# Patient Record
Sex: Male | Born: 1995 | Race: White | Hispanic: No | Marital: Single | Smoking: Current some day smoker
Health system: Southern US, Community
[De-identification: ages and names within clinical notes are randomized; demographics above are authoritative.]

## PROBLEM LIST (undated history)

## (undated) DIAGNOSIS — F909 Attention-deficit hyperactivity disorder, unspecified type: Secondary | ICD-10-CM

## (undated) DIAGNOSIS — F1910 Other psychoactive substance abuse, uncomplicated: Secondary | ICD-10-CM

## (undated) HISTORY — PX: FOOT SURGERY: SHX648

---
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
## 2015-02-11 ENCOUNTER — Emergency Department
Admit: 2015-02-11 | Disposition: A | Discharge status: Home / Self Care | Payer: Self-pay | Admitting: Emergency Medicine

## 2015-02-11 LAB — URINALYSIS, COMPLETE
BILIRUBIN, UR: NEGATIVE
Bacteria: NONE SEEN
Blood: NEGATIVE
Glucose,UR: NEGATIVE mg/dL (ref 0–75)
Ketone: NEGATIVE
Leukocyte Esterase: NEGATIVE
NITRITE: NEGATIVE
PH: 7 (ref 4.5–8.0)
Protein: NEGATIVE
Specific Gravity: 1.011 (ref 1.003–1.030)
Squamous Epithelial: NONE SEEN

## 2016-04-15 ENCOUNTER — Encounter: Payer: Self-pay | Admitting: Emergency Medicine

## 2016-04-15 ENCOUNTER — Emergency Department: Payer: Medicaid Other

## 2016-04-15 ENCOUNTER — Emergency Department
Admission: EM | Admit: 2016-04-15 | Discharge: 2016-04-15 | Disposition: A | Discharge status: Home / Self Care | Payer: Medicaid Other | Attending: Emergency Medicine | Admitting: Emergency Medicine

## 2016-04-15 ENCOUNTER — Other Ambulatory Visit: Payer: Self-pay

## 2016-04-15 DIAGNOSIS — Z79899 Other long term (current) drug therapy: Secondary | ICD-10-CM | POA: Diagnosis not present

## 2016-04-15 DIAGNOSIS — F10129 Alcohol abuse with intoxication, unspecified: Secondary | ICD-10-CM | POA: Insufficient documentation

## 2016-04-15 DIAGNOSIS — F17200 Nicotine dependence, unspecified, uncomplicated: Secondary | ICD-10-CM | POA: Insufficient documentation

## 2016-04-15 DIAGNOSIS — Y9389 Activity, other specified: Secondary | ICD-10-CM | POA: Insufficient documentation

## 2016-04-15 DIAGNOSIS — W1839XA Other fall on same level, initial encounter: Secondary | ICD-10-CM | POA: Diagnosis not present

## 2016-04-15 DIAGNOSIS — Y999 Unspecified external cause status: Secondary | ICD-10-CM | POA: Insufficient documentation

## 2016-04-15 DIAGNOSIS — Y9289 Other specified places as the place of occurrence of the external cause: Secondary | ICD-10-CM | POA: Insufficient documentation

## 2016-04-15 DIAGNOSIS — F909 Attention-deficit hyperactivity disorder, unspecified type: Secondary | ICD-10-CM | POA: Insufficient documentation

## 2016-04-15 DIAGNOSIS — F10920 Alcohol use, unspecified with intoxication, uncomplicated: Secondary | ICD-10-CM

## 2016-04-15 DIAGNOSIS — F1290 Cannabis use, unspecified, uncomplicated: Secondary | ICD-10-CM | POA: Insufficient documentation

## 2016-04-15 DIAGNOSIS — S0990XA Unspecified injury of head, initial encounter: Secondary | ICD-10-CM | POA: Diagnosis not present

## 2016-04-15 HISTORY — DX: Attention-deficit hyperactivity disorder, unspecified type: F90.9

## 2016-04-15 NOTE — ED Notes (Signed)
Spoke with dr. York Ceriseforbach regarding pt's chief complaint and possible head injury. md states will evaluate, no new orders received.

## 2016-04-15 NOTE — ED Notes (Signed)
Explanation of treatment process explained to pt who states "i have a job i have to go to in the morning. i really need to get out of here."

## 2016-04-15 NOTE — ED Notes (Signed)
Per ems pt's "lady friend" states pt smoked too much marijuana. Pt states drank "a six pack of beer and smoke some weed." per ems pt fell off porch, striking head. perrl sluggish 4mm. Pt moves all extremities. resps unlabored.

## 2016-04-15 NOTE — ED Provider Notes (Signed)
[facility] Emergency Department Provider Note  ____________________________________________  Time seen: Approximately 3:14 AM  I have reviewed the triage vital signs and the nursing notes.   HISTORY  Chief Complaint Alcohol Intoxication and Head Injury  The patient may be slightly intoxicated although he appears clinically sober at this time, but it may still affect his history  HPI Matthias HughsBrandon K Monarch is a 20 y.o. male who admits to smoking marijuana and drinking alcohol earlier tonight presents for evaluation of a possible head injury.  Reportedly he had several beers and was smoking weed earlier tonight and then fell off porch.  He struckhis forehead and face on the concrete and reportedly was somewhat slow to respond afterwards.  He currently seems clinically sober and endorses no pain in his head or his neck.  He says that he feels fine and denies any extremity injuries as well.  He describes his symptoms as nonexistent although reportedly earlier he was somewhat somnolent and altered.  The onset of symptoms was acute.   Past Medical History  Diagnosis Date  . ADHD (attention deficit hyperactivity disorder)     There are no active problems to display for this patient.   Past Surgical History  Procedure Laterality Date  . Foot surgery      Current Outpatient Rx  Name  Route  Sig  Dispense  Refill  . amphetamine-dextroamphetamine (ADDERALL) 10 MG tablet   Oral   Take 1 tablet by mouth daily.      0   . polyethylene glycol powder (GLYCOLAX/MIRALAX) powder   Oral   Take 17 g by mouth daily.      5   . VYVANSE 50 MG capsule   Oral   Take 1 capsule by mouth every morning.      0     Dispense as written.     Allergies Review of patient's allergies indicates no known allergies.  History reviewed. No pertinent family history.  Social History Social History  Substance Use Topics  . Smoking status: Current Some Day Smoker  .  Smokeless tobacco: Never Used  . Alcohol Use: Yes    Review of Systems Constitutional: No fever/chills Eyes: No visual changes. ENT: No sore throat. Cardiovascular: Denies chest pain. Respiratory: Denies shortness of breath. Gastrointestinal: No abdominal pain.  No nausea, no vomiting.  No diarrhea.  No constipation. Genitourinary: Negative for dysuria. Musculoskeletal: Negative for back pain. Skin: Negative for rash. Neurological: Negative for headaches, focal weakness or numbness.  10-point ROS otherwise negative.  ____________________________________________   PHYSICAL EXAM:  VITAL SIGNS: ED Triage Vitals  Enc Vitals Group     BP 04/15/16 0136 118/75 mmHg     Pulse Rate 04/15/16 0136 93     Resp 04/15/16 0136 14     Temp 04/15/16 0136 98.9 F (37.2 C)     Temp Source 04/15/16 0136 Oral     SpO2 04/15/16 0136 98 %     Weight 04/15/16 0136 121 lb (54.885 kg)     Height 04/15/16 0136 5\' 4"  (1.626 m)     Head Cir --      Peak Flow --      Pain Score --      Pain Loc --      Pain Edu? --      Excl. in GC? --     Constitutional: Alert and oriented. Well appearing and in no acute distress. Eyes: Conjunctivae are Normal.  Pupils were initially dilated but  the patient is in a dark room and they were appropriately reactive to light. Head: Mild contusion on forehead otherwise unremarkable and atraumatic Nose: No congestion/rhinnorhea. Mouth/Throat: Mucous membranes are moist.  Oropharynx non-erythematous. Neck: No stridor.  No meningeal signs.  No cervical spine tenderness to palpation. Cardiovascular: Normal rate, regular rhythm. Good peripheral circulation. Grossly normal heart sounds.   Respiratory: Normal respiratory effort.  No retractions. Lungs CTAB. Gastrointestinal: Soft and nontender. No distention.  Musculoskeletal: No lower extremity tenderness nor edema. No gross deformities of extremities. Neurologic:  Normal speech and language. No gross focal neurologic  deficits are appreciated.  Skin:  Skin is warm, dry and intact. No rash noted. Psychiatric: Mood and affect are normal. Speech and behavior are normal.  ____________________________________________   LABS (all labs ordered are listed, but only abnormal results are displayed)  Labs Reviewed - No data to display ____________________________________________  EKG  ED ECG REPORT I, Kirat Mezquita, the attending physician, personally viewed and interpreted this ECG.  Date: 04/15/2016 EKG Time: 01:28 Rate: 98 Rhythm: normal sinus rhythm QRS Axis: normal Intervals: normal ST/T Wave abnormalities: Early repolarization in leads V2 and V3 with no reciprocal changes Conduction Disturbances: none Narrative Interpretation: unremarkable  ____________________________________________  RADIOLOGY   Ct Head Wo Contrast  04/15/2016  CLINICAL DATA:  20 year old male with fall and trauma the head EXAM: CT HEAD WITHOUT CONTRAST TECHNIQUE: Contiguous axial images were obtained from the base of the skull through the vertex without intravenous contrast. COMPARISON:  Head CT dated 02/14/2014 FINDINGS: The ventricles and the sulci are appropriate in size for the patient's age. There is no intracranial hemorrhage. No midline shift or mass effect identified. The gray-white matter differentiation is preserved. There is mild diffuse mucoperiosteal thickening of paranasal sinuses. No air-fluid levels. The mastoid air cells are clear. Calvarium is intact. IMPRESSION: No acute intracranial pathology. Electronically Signed   By: Elgie CollardArash  Radparvar M.D.   On: 04/15/2016 03:42    ____________________________________________   PROCEDURES  Procedure(s) performed:   Procedures   ____________________________________________   INITIAL IMPRESSION / ASSESSMENT AND PLAN / ED COURSE  Pertinent labs & imaging results that were available during my care of the patient were reviewed by me and considered in my medical  decision making (see chart for details).  The patient is well-appearing and in no acute distress with no symptoms.  He appears clinically sober and stable.  His head CT is unremarkable.  I feel that he does not require additional workup and he agrees.  He is being discharged in the company of a sober adult.  I gave my usual and customary return precautions.   ____________________________________________  FINAL CLINICAL IMPRESSION(S) / ED DIAGNOSES  Final diagnoses:  Closed head injury, initial encounter  Alcohol intoxication, uncomplicated (HCC)     MEDICATIONS GIVEN DURING THIS VISIT:  Medications - No data to display   NEW OUTPATIENT MEDICATIONS STARTED DURING THIS VISIT:  New Prescriptions   No medications on file      Note:  This document was prepared using Dragon voice recognition software and may include unintentional dictation errors.   Loleta Roseory Haadiya Frogge, MD 04/15/16 [phone]

## 2016-04-15 NOTE — ED Notes (Signed)
Pt continues to await ride home.

## 2016-04-15 NOTE — ED Notes (Signed)
Pt to be discharged, pt is alert and oriented x4, perrl 3mm and brisk. Pt denies pain. Pt requesting sister alexis to come and pick him up. Sister phoned with request per pt. Skin warm and dry, pt ambulatory without difficulty.

## 2016-04-15 NOTE — Discharge Instructions (Signed)
You have been seen in the Emergency Department (ED) today for a fall.  Your work up does not show any concerning injuries.  Please take over-the-counter ibuprofen and/or Tylenol as needed for your pain (unless you have an allergy or your doctor as told you not to take them), or take any prescribed medication as instructed.  Please follow up with your doctor regarding today's Emergency Department (ED) visit and your recent fall.    Return to the ED if you have any headache, confusion, slurred speech, weakness/numbness of any arm or leg, or any increased pain.   Head Injury, Adult You have a head injury. Headaches and throwing up (vomiting) are common after a head injury. It should be easy to wake up from sleeping. Sometimes you must stay in the hospital. Most problems happen within the first 24 hours. Side effects may occur up to 7-10 days after the injury.  WHAT ARE THE TYPES OF HEAD INJURIES? Head injuries can be as minor as a bump. Some head injuries can be more severe. More severe head injuries include:  A jarring injury to the brain (concussion).  A bruise of the brain (contusion). This mean there is bleeding in the brain that can cause swelling.  A cracked skull (skull fracture).  Bleeding in the brain that collects, clots, and forms a bump (hematoma). WHEN SHOULD I GET HELP RIGHT AWAY?   You are confused or sleepy.  You cannot be woken up.  You feel sick to your stomach (nauseous) or keep throwing up (vomiting).  Your dizziness or unsteadiness is getting worse.  You have very bad, lasting headaches that are not helped by medicine. Take medicines only as told by your doctor.  You cannot use your arms or legs like normal.  You cannot walk.  You notice changes in the black spots in the center of the colored part of your eye (pupil).  You have clear or bloody fluid coming from your nose or ears.  You have trouble seeing. During the next 24 hours after the injury, you must  stay with someone who can watch you. This person should get help right away (call 911 in the [place] if you start to shake and are not able to control it (have seizures), you pass out, or you are unable to wake up. HOW CAN I PREVENT A HEAD INJURY IN THE FUTURE?  Wear seat belts.  Wear a helmet while bike riding and playing sports like football.  Stay away from dangerous activities around the house. WHEN CAN I RETURN TO NORMAL ACTIVITIES AND ATHLETICS? See your doctor before doing these activities. You should not do normal activities or play contact sports until 1 week after the following symptoms have stopped:  Headache that does not go away.  Dizziness.  Poor attention.  Confusion.  Memory problems.  Sickness to your stomach or throwing up.  Tiredness.  Fussiness.  Bothered by bright lights or loud noises.  Anxiousness or depression.  Restless sleep. MAKE SURE YOU:   Understand these instructions.  Will watch your condition.  Will get help right away if you are not doing well or get worse.   This information is not intended to replace advice given to you by your health care provider. Make sure you discuss any questions you have with your health care provider.   Document Released: 09/21/2008 Document Revised: 10/30/2014 Document Reviewed: 06/16/2013 Elsevier Interactive Patient Education 2016 [employer]  Alcohol Intoxication Alcohol intoxication occurs when you drink enough alcohol that  it affects your ability to function. It can be mild or very severe. Drinking a lot of alcohol in a short time is called binge drinking. This can be very harmful. Drinking alcohol can also be more dangerous if you are taking medicines or other drugs. Some of the effects caused by alcohol may include:  Loss of coordination.  Changes in mood and behavior.  Unclear thinking.  Trouble talking (slurred speech).  Throwing up (vomiting).  Confusion.  Slowed breathing.  Twitching  and shaking (seizures).  Loss of consciousness. HOME CARE  Do not drive after drinking alcohol.  Drink enough water and fluids to keep your pee (urine) clear or pale yellow. Avoid caffeine.  Only take medicine as told by your doctor. GET HELP IF:  You throw up (vomit) many times.  You do not feel better after a few days.  You frequently have alcohol intoxication. Your doctor can help decide if you should see a substance use treatment counselor. GET HELP RIGHT AWAY IF:  You become shaky when you stop drinking.  You have twitching and shaking.  You throw up blood. It may look bright red or like coffee grounds.  You notice blood in your poop (bowel movements).  You become lightheaded or pass out (faint). MAKE SURE YOU:   Understand these instructions.  Will watch your condition.  Will get help right away if you are not doing well or get worse.   This information is not intended to replace advice given to you by your health care provider. Make sure you discuss any questions you have with your health care provider.   Document Released: 03/27/2008 Document Revised: 06/11/2013 Document Reviewed: 03/14/2013 Elsevier Interactive Patient Education 2016 Elsevier Inc.  Chemical Dependency Chemical dependency is an addiction to drugs or alcohol. It is characterized by the repeated behavior of seeking out and using drugs and alcohol despite harmful consequences to the health and safety of ones self and others.  RISK FACTORS There are certain situations or behaviors that increase a person's risk for chemical dependency. These include:  A family history of chemical dependency.  A history of mental health issues, including depression and anxiety.  A home environment where drugs and alcohol are easily available to you.  Drug or alcohol use at a young age. SYMPTOMS  The following symptoms can indicate chemical dependency:  Inability to limit the use of drugs or  alcohol.  Nausea, sweating, shakiness, and anxiety that occurs when alcohol or drugs are not being used.  An increase in amount of drugs or alcohol that is necessary to get drunk or high. People who experience these symptoms can assess their use of drugs and alcohol by asking themselves the following questions:  Have you been told by friends or family that they are worried about your use of alcohol or drugs?  Do friends and family ever tell you about things you did while drinking alcohol or using drugs that you do not remember?  Do you lie about using alcohol or drugs or about the amounts you use?  Do you have difficulty completing daily tasks unless you use alcohol or drugs?  Is the level of your work or school performance lower because of your drug or alcohol use?  Do you get sick from using drugs or alcohol but keep using anyway?  Do you feel uncomfortable in social situations unless you use alcohol or drugs?  Do you use drugs or alcohol to help forget problems? An answer of yes to any  of these questions may indicate chemical dependency. Professional evaluation is suggested.   This information is not intended to replace advice given to you by your health care provider. Make sure you discuss any questions you have with your health care provider.   Document Released: 10/03/2001 Document Revised: 01/01/2012 Document Reviewed: 12/15/2010 Elsevier Interactive Patient Education [employer].

## 2018-12-25 ENCOUNTER — Encounter (HOSPITAL_COMMUNITY): Payer: Self-pay

## 2018-12-25 ENCOUNTER — Emergency Department
Admission: EM | Admit: 2018-12-25 | Discharge: 2018-12-25 | Disposition: A | Discharge status: Home / Self Care | Payer: Self-pay | Attending: Emergency Medicine | Admitting: Emergency Medicine

## 2018-12-25 ENCOUNTER — Other Ambulatory Visit: Payer: Self-pay

## 2018-12-25 ENCOUNTER — Emergency Department (HOSPITAL_COMMUNITY)
Admission: EM | Admit: 2018-12-25 | Discharge: 2018-12-25 | Disposition: A | Discharge status: Home / Self Care | Payer: Self-pay | Attending: Emergency Medicine | Admitting: Emergency Medicine

## 2018-12-25 DIAGNOSIS — K921 Melena: Secondary | ICD-10-CM | POA: Insufficient documentation

## 2018-12-25 DIAGNOSIS — R197 Diarrhea, unspecified: Secondary | ICD-10-CM | POA: Insufficient documentation

## 2018-12-25 DIAGNOSIS — R1013 Epigastric pain: Secondary | ICD-10-CM | POA: Insufficient documentation

## 2018-12-25 DIAGNOSIS — Z5321 Procedure and treatment not carried out due to patient leaving prior to being seen by health care provider: Secondary | ICD-10-CM | POA: Insufficient documentation

## 2018-12-25 DIAGNOSIS — F909 Attention-deficit hyperactivity disorder, unspecified type: Secondary | ICD-10-CM | POA: Insufficient documentation

## 2018-12-25 DIAGNOSIS — F17210 Nicotine dependence, cigarettes, uncomplicated: Secondary | ICD-10-CM | POA: Insufficient documentation

## 2018-12-25 DIAGNOSIS — Z79899 Other long term (current) drug therapy: Secondary | ICD-10-CM | POA: Insufficient documentation

## 2018-12-25 DIAGNOSIS — R112 Nausea with vomiting, unspecified: Secondary | ICD-10-CM | POA: Insufficient documentation

## 2018-12-25 DIAGNOSIS — F1290 Cannabis use, unspecified, uncomplicated: Secondary | ICD-10-CM | POA: Insufficient documentation

## 2018-12-25 DIAGNOSIS — R109 Unspecified abdominal pain: Secondary | ICD-10-CM | POA: Insufficient documentation

## 2018-12-25 DIAGNOSIS — R1012 Left upper quadrant pain: Secondary | ICD-10-CM | POA: Insufficient documentation

## 2018-12-25 LAB — COMPREHENSIVE METABOLIC PANEL
ALT: 22 U/L (ref 0–44)
ANION GAP: 13 (ref 5–15)
AST: 29 U/L (ref 15–41)
Albumin: 4.5 g/dL (ref 3.5–5.0)
Alkaline Phosphatase: 67 U/L (ref 38–126)
BUN: <5 mg/dL — ABNORMAL LOW (ref 6–20)
CHLORIDE: 104 mmol/L (ref 98–111)
CO2: 24 mmol/L (ref 22–32)
Calcium: 8.9 mg/dL (ref 8.9–10.3)
Creatinine, Ser: 0.79 mg/dL (ref 0.61–1.24)
GFR calc Af Amer: >60 mL/min (ref >60)
GFR calc non Af Amer: >60 mL/min (ref >60)
Glucose, Bld: 97 mg/dL (ref 70–99)
Potassium: 3.1 mmol/L — ABNORMAL LOW (ref 3.5–5.1)
SODIUM: 141 mmol/L (ref 135–145)
Total Bilirubin: 0.3 mg/dL (ref 0.3–1.2)
Total Protein: 7.7 g/dL (ref 6.5–8.1)

## 2018-12-25 LAB — CBC
HCT: 45.3 % (ref 39.0–52.0)
HCT: 44.8 % (ref 39.0–52.0)
Hemoglobin: 16.1 g/dL (ref 13.0–17.0)
Hemoglobin: 15.7 g/dL (ref 13.0–17.0)
MCH: 30.1 pg (ref 26.0–34.0)
MCH: 29.8 pg (ref 26.0–34.0)
MCHC: 35.5 g/dL (ref 30.0–36.0)
MCHC: 35 g/dL (ref 30.0–36.0)
MCV: 84.7 fL (ref 80.0–100.0)
MCV: 85.2 fL (ref 80.0–100.0)
PLATELETS: 352 10*3/uL (ref 150–400)
PLATELETS: 339 10*3/uL (ref 150–400)
RBC: 5.35 MIL/uL (ref 4.22–5.81)
RBC: 5.26 MIL/uL (ref 4.22–5.81)
RDW: 12 % (ref 11.5–15.5)
RDW: 11.9 % (ref 11.5–15.5)
WBC: 10.2 10*3/uL (ref 4.0–10.5)
WBC: 9.1 10*3/uL (ref 4.0–10.5)
nRBC: 0 % (ref 0.0–0.2)
nRBC: 0 % (ref 0.0–0.2)

## 2018-12-25 LAB — ETHANOL: Alcohol, Ethyl (B): 211 mg/dL — ABNORMAL HIGH (ref <10)

## 2018-12-25 LAB — LIPASE, BLOOD: Lipase: 38 U/L (ref 11–51)

## 2018-12-25 MED ORDER — ALUM & MAG HYDROXIDE-SIMETH 200-200-20 MG/5ML PO SUSP
30.0000 mL | Freq: Once | ORAL | Status: AC
  Administered 2018-12-25: 30 mL via ORAL
  Filled 2018-12-25: qty 30

## 2018-12-25 MED ORDER — ONDANSETRON 4 MG PO TBDP: 4.0000 mg | ORAL_TABLET | Freq: Three times a day (TID) | ORAL | 0 refills | Status: DC | PRN

## 2018-12-25 MED ORDER — DICYCLOMINE HCL 10 MG PO CAPS
10.0000 mg | ORAL_CAPSULE | Freq: Once | ORAL | Status: AC
  Administered 2018-12-25: 10 mg via ORAL
  Filled 2018-12-25 (×2): qty 1

## 2018-12-25 MED ORDER — SODIUM CHLORIDE 0.9% FLUSH: 3.0000 mL | Freq: Once | INTRAVENOUS | Status: DC

## 2018-12-25 MED ORDER — ONDANSETRON 4 MG PO TBDP
4.0000 mg | ORAL_TABLET | Freq: Once | ORAL | Status: AC
  Administered 2018-12-25: 4 mg via ORAL
  Filled 2018-12-25: qty 1

## 2018-12-25 MED ORDER — ACETAMINOPHEN 500 MG PO TABS
1000.0000 mg | ORAL_TABLET | Freq: Once | ORAL | Status: AC
  Administered 2018-12-25: 1000 mg via ORAL
  Filled 2018-12-25: qty 2

## 2018-12-25 MED ORDER — LIDOCAINE VISCOUS HCL 2 % MT SOLN
15.0000 mL | Freq: Once | OROMUCOSAL | Status: AC
  Administered 2018-12-25: 15 mL via ORAL
  Filled 2018-12-25: qty 15

## 2018-12-25 NOTE — ED Triage Notes (Signed)
Abd pain for few days, states has been vomiting. Has vomited x 10 todays, diarrhea x 3 today.

## 2018-12-25 NOTE — ED Provider Notes (Signed)
[facility] EMERGENCY DEPARTMENT Provider Note   CSN: [id number] Arrival date & time: 12/25/18  0447    History   Chief Complaint Chief Complaint  Patient presents with  . Abdominal Pain    HPI Seth Luna is a 23 y.o. male.     23 yo M with a chief complaint of epigastric and left upper quadrant abdominal pain nausea vomiting and diarrhea.  This been an ongoing issue for him seems to come and go.  States that this time he has had some bleeding.  That is also not atypical for him he has been having bleeding off and on and had seen a gastroenterologist in the past and was diagnosed with irritable bowel syndrome.  He thinks the blood output is more than it has been typically.  He has felt a little bit lightheaded.  He went to a different hospital and found that the wait was too long and so came here instead.  He denies fevers or chills.  The history is provided by the patient.  Abdominal Pain  Pain location:  LLQ Pain quality: bloating   Pain radiates to:  Does not radiate Pain severity:  Moderate Onset quality:  Sudden Duration:  2 days Timing:  Constant Progression:  Unchanged Chronicity:  New Context: alcohol use   Relieved by:  Nothing Worsened by:  Nothing Ineffective treatments:  None tried Associated symptoms: diarrhea, nausea and vomiting   Associated symptoms: no chest pain, no chills, no fever and no shortness of breath     Past Medical History:  Diagnosis Date  . ADHD (attention deficit hyperactivity disorder)     There are no active problems to display for this patient.   Past Surgical History:  Procedure Laterality Date  . FOOT SURGERY          Home Medications    Prior to Admission medications   Medication Sig Start Date End Date Taking? Authorizing Provider  amphetamine-dextroamphetamine (ADDERALL) 10 MG tablet Take 1 tablet by mouth daily. 03/17/16   [provider]  ondansetron (ZOFRAN ODT) 4 MG disintegrating  tablet Take 1 tablet (4 mg total) by mouth every 8 (eight) hours as needed for nausea or vomiting. 12/25/18   Melene Plan, DO  polyethylene glycol powder (GLYCOLAX/MIRALAX) powder Take 17 g by mouth daily. 03/17/16   [provider]  VYVANSE 50 MG capsule Take 1 capsule by mouth every morning. 03/17/16   [provider]    Family History No family history on file.  Social History Social History   Tobacco Use  . Smoking status: Current Some Day Smoker  . Smokeless tobacco: Never Used  Substance Use Topics  . Alcohol use: Yes  . Drug use: Yes    Types: Marijuana     Allergies   Patient has no known allergies.   Review of Systems Review of Systems  Constitutional: Negative for chills and fever.  HENT: Negative for congestion and facial swelling.   Eyes: Negative for discharge and visual disturbance.  Respiratory: Negative for shortness of breath.   Cardiovascular: Negative for chest pain and palpitations.  Gastrointestinal: Positive for abdominal pain, blood in stool, diarrhea, nausea and vomiting.  Musculoskeletal: Negative for arthralgias and myalgias.  Skin: Negative for color change and rash.  Neurological: Negative for tremors, syncope and headaches.  Psychiatric/Behavioral: Negative for confusion and dysphoric mood.     Physical Exam Updated Vital Signs BP 125/70   Pulse 89   Temp 98.4 F (36.9 C) (  Oral)   Resp 17   Ht 5\' 3"  (1.6 m)   Wt 56.7 kg   SpO2 95%   BMI 22.14 kg/m   Physical Exam Vitals signs and nursing note reviewed.  Constitutional:      Appearance: He is well-developed.  HENT:     Head: Normocephalic and atraumatic.  Eyes:     Pupils: Pupils are equal, round, and reactive to light.  Neck:     Musculoskeletal: Normal range of motion and neck supple.     Vascular: No JVD.  Cardiovascular:     Rate and Rhythm: Normal rate and regular rhythm.     Heart sounds: No murmur. No friction rub. No gallop.   Pulmonary:     Effort:  No respiratory distress.     Breath sounds: No wheezing.  Abdominal:     General: There is no distension.     Tenderness: There is no abdominal tenderness (no noted focal abdominal tenderness). There is no guarding or rebound.  Musculoskeletal: Normal range of motion.  Skin:    Coloration: Skin is not pale.     Findings: No rash.  Neurological:     Mental Status: He is alert and oriented to person, place, and time.  Psychiatric:        Behavior: Behavior normal.      ED Treatments / Results  Labs (all labs ordered are listed, but only abnormal results are displayed) Labs Reviewed  CBC  URINALYSIS, ROUTINE W REFLEX MICROSCOPIC    EKG None  Radiology No results found.  Procedures Procedures (including critical care time)  Medications Ordered in ED Medications  sodium chloride flush (NS) 0.9 % injection 3 mL (has no administration in time range)  alum & mag hydroxide-simeth (MAALOX/MYLANTA) 200-200-20 MG/5ML suspension 30 mL (30 mLs Oral Given 12/25/18 0628)    And  lidocaine (XYLOCAINE) 2 % viscous mouth solution 15 mL (15 mLs Oral Given 12/25/18 0628)  ondansetron (ZOFRAN-ODT) disintegrating tablet 4 mg (4 mg Oral Given 12/25/18 3086)  acetaminophen (TYLENOL) tablet 1,000 mg (1,000 mg Oral Given 12/25/18 5784)     Initial Impression / Assessment and Plan / ED Course  I have reviewed the triage vital signs and the nursing notes.  Pertinent labs & imaging results that were available during my care of the patient were reviewed by me and considered in my medical decision making (see chart for details).        23 yo F with a cc of nausea vomiting and diarrhea.  The patient describes the emesis and the diarrhea is grossly bloody though the patient is well-appearing and nontoxic.  Has had no events since he has been here.  My abdominal exam is also pretty benign.  I reviewed the lab work done at the outside facility and the patient had an alcohol level in the 200s, I discussed  with him that this may be the cause of his acute GI symptoms.  I repeated a hemoglobin here this not significantly changed from his check earlier this evening.  With his age and a concern for rectal bleeding I will refer him to gastroenterology to evaluate for inflammatory bowel disease though it sounds that the patient has diagnosis of irritable bowel syndrome.  I cautioned him to return for any worsening of his symptoms.  He was given a GI cocktail with mild improvement.  6:40 AM:  I have discussed the diagnosis/risks/treatment options with the patient and family and believe the pt to be eligible for  discharge home to follow-up with PCP. We also discussed returning to the ED immediately if new or worsening sx occur. We discussed the sx which are most concerning (e.g., sudden worsening pain, fever, inability to tolerate by mouth) that necessitate immediate return. Medications administered to the patient during their visit and any new prescriptions provided to the patient are listed below.  Medications given during this visit Medications  sodium chloride flush (NS) 0.9 % injection 3 mL (has no administration in time range)  alum & mag hydroxide-simeth (MAALOX/MYLANTA) 200-200-20 MG/5ML suspension 30 mL (30 mLs Oral Given 12/25/18 6314)    And  lidocaine (XYLOCAINE) 2 % viscous mouth solution 15 mL (15 mLs Oral Given 12/25/18 0628)  ondansetron (ZOFRAN-ODT) disintegrating tablet 4 mg (4 mg Oral Given 12/25/18 9702)  acetaminophen (TYLENOL) tablet 1,000 mg (1,000 mg Oral Given 12/25/18 6378)     The patient appears reasonably screen and/or stabilized for discharge and I doubt any other medical condition or other [facility] requiring further screening, evaluation, or treatment in the ED at this time prior to discharge.    Final Clinical Impressions(s) / ED Diagnoses   Final diagnoses:  Nausea vomiting and diarrhea    ED Discharge Orders         Ordered    ondansetron (ZOFRAN ODT) 4 MG disintegrating tablet   Every 8 hours PRN     12/25/18 0635           Melene Plan, DO 12/25/18 [phone]

## 2018-12-25 NOTE — ED Triage Notes (Signed)
Pt reports that he has been having n/v/d for the past 3 days, reports blood in his vomit and bowel. Seen at [facility] today for the same

## 2018-12-25 NOTE — ED Notes (Signed)
Patient verbalizes understanding of discharge instructions. Opportunity for questioning and answers were provided. Armband removed by staff, pt discharged from ED ambulatory.   

## 2018-12-25 NOTE — Discharge Instructions (Addendum)
Return for worsening breathing sudden worsening abdominal pain inability to eat or drink.  Try zantac or pepcid twice a day.  Try to avoid things that may make this worse, most commonly these are spicy foods tomato based products fatty foods chocolate and peppermint.  Alcohol and tobacco can also make this worse.  Return to the emergency department for sudden worsening pain fever or inability to eat or drink.

## 2019-02-22 ENCOUNTER — Other Ambulatory Visit: Payer: Self-pay

## 2019-02-22 ENCOUNTER — Emergency Department
Admission: EM | Admit: 2019-02-22 | Discharge: 2019-02-22 | Disposition: A | Discharge status: Home / Self Care | Payer: Self-pay | Attending: Student in an Organized Health Care Education/Training Program | Admitting: Student in an Organized Health Care Education/Training Program

## 2019-02-22 ENCOUNTER — Emergency Department: Payer: Self-pay

## 2019-02-22 ENCOUNTER — Encounter: Payer: Self-pay | Admitting: Emergency Medicine

## 2019-02-22 ENCOUNTER — Inpatient Hospital Stay
Admission: EM | Admit: 2019-02-22 | Discharge: 2019-02-23 | DRG: 091 | Disposition: A | Discharge status: Home / Self Care | Payer: Self-pay | Attending: Specialist | Admitting: Specialist

## 2019-02-22 DIAGNOSIS — F909 Attention-deficit hyperactivity disorder, unspecified type: Secondary | ICD-10-CM | POA: Diagnosis present

## 2019-02-22 DIAGNOSIS — F22 Delusional disorders: Secondary | ICD-10-CM | POA: Insufficient documentation

## 2019-02-22 DIAGNOSIS — F17200 Nicotine dependence, unspecified, uncomplicated: Secondary | ICD-10-CM | POA: Diagnosis present

## 2019-02-22 DIAGNOSIS — Z79899 Other long term (current) drug therapy: Secondary | ICD-10-CM | POA: Insufficient documentation

## 2019-02-22 DIAGNOSIS — Z888 Allergy status to other drugs, medicaments and biological substances status: Secondary | ICD-10-CM

## 2019-02-22 DIAGNOSIS — G9340 Encephalopathy, unspecified: Secondary | ICD-10-CM | POA: Diagnosis present

## 2019-02-22 DIAGNOSIS — F1910 Other psychoactive substance abuse, uncomplicated: Secondary | ICD-10-CM | POA: Diagnosis present

## 2019-02-22 DIAGNOSIS — Y902 Blood alcohol level of 40-59 mg/100 ml: Secondary | ICD-10-CM | POA: Insufficient documentation

## 2019-02-22 DIAGNOSIS — F1010 Alcohol abuse, uncomplicated: Secondary | ICD-10-CM | POA: Diagnosis present

## 2019-02-22 DIAGNOSIS — R443 Hallucinations, unspecified: Secondary | ICD-10-CM

## 2019-02-22 DIAGNOSIS — G2402 Drug induced acute dystonia: Principal | ICD-10-CM

## 2019-02-22 DIAGNOSIS — F1510 Other stimulant abuse, uncomplicated: Secondary | ICD-10-CM | POA: Diagnosis present

## 2019-02-22 DIAGNOSIS — G2409 Other drug induced dystonia: Secondary | ICD-10-CM | POA: Diagnosis present

## 2019-02-22 DIAGNOSIS — G92 Toxic encephalopathy: Secondary | ICD-10-CM | POA: Diagnosis present

## 2019-02-22 DIAGNOSIS — T43621A Poisoning by amphetamines, accidental (unintentional), initial encounter: Secondary | ICD-10-CM | POA: Diagnosis present

## 2019-02-22 HISTORY — DX: Other psychoactive substance abuse, uncomplicated: F19.10

## 2019-02-22 LAB — CBC
HCT: 49.6 % (ref 39.0–52.0)
Hemoglobin: 17.7 g/dL — ABNORMAL HIGH (ref 13.0–17.0)
MCH: 30.3 pg (ref 26.0–34.0)
MCHC: 35.7 g/dL (ref 30.0–36.0)
MCV: 84.9 fL (ref 80.0–100.0)
Platelets: 338 10*3/uL (ref 150–400)
RBC: 5.84 MIL/uL — ABNORMAL HIGH (ref 4.22–5.81)
RDW: 12.2 % (ref 11.5–15.5)
WBC: 7 10*3/uL (ref 4.0–10.5)
nRBC: 0 % (ref 0.0–0.2)

## 2019-02-22 LAB — COMPREHENSIVE METABOLIC PANEL
ALT: 21 U/L (ref 0–44)
ALT: 22 U/L (ref 0–44)
AST: 22 U/L (ref 15–41)
AST: 44 U/L — ABNORMAL HIGH (ref 15–41)
Albumin: 4.6 g/dL (ref 3.5–5.0)
Albumin: 4.4 g/dL (ref 3.5–5.0)
Alkaline Phosphatase: 58 U/L (ref 38–126)
Alkaline Phosphatase: 52 U/L (ref 38–126)
Anion gap: 15 (ref 5–15)
Anion gap: 14 (ref 5–15)
BUN: 6 mg/dL (ref 6–20)
BUN: 10 mg/dL (ref 6–20)
CO2: 24 mmol/L (ref 22–32)
CO2: 24 mmol/L (ref 22–32)
Calcium: 9.5 mg/dL (ref 8.9–10.3)
Calcium: 9 mg/dL (ref 8.9–10.3)
Chloride: 100 mmol/L (ref 98–111)
Chloride: 99 mmol/L (ref 98–111)
Creatinine, Ser: 0.73 mg/dL (ref 0.61–1.24)
Creatinine, Ser: 0.75 mg/dL (ref 0.61–1.24)
GFR calc Af Amer: >60 mL/min (ref >60)
GFR calc Af Amer: >60 mL/min (ref >60)
GFR calc non Af Amer: >60 mL/min (ref >60)
GFR calc non Af Amer: >60 mL/min (ref >60)
Glucose, Bld: 108 mg/dL — ABNORMAL HIGH (ref 70–99)
Glucose, Bld: 113 mg/dL — ABNORMAL HIGH (ref 70–99)
Potassium: 3 mmol/L — ABNORMAL LOW (ref 3.5–5.1)
Potassium: 3.8 mmol/L (ref 3.5–5.1)
Sodium: 139 mmol/L (ref 135–145)
Sodium: 137 mmol/L (ref 135–145)
Total Bilirubin: 0.7 mg/dL (ref 0.3–1.2)
Total Bilirubin: 1.3 mg/dL — ABNORMAL HIGH (ref 0.3–1.2)
Total Protein: 8.2 g/dL — ABNORMAL HIGH (ref 6.5–8.1)
Total Protein: 7.6 g/dL (ref 6.5–8.1)

## 2019-02-22 LAB — CSF CELL COUNT WITH DIFFERENTIAL
Eosinophils, CSF: 0 %
Eosinophils, CSF: 0 %
Lymphs, CSF: 20 %
Lymphs, CSF: 20 %
Monocyte-Macrophage-Spinal Fluid: 20 %
Monocyte-Macrophage-Spinal Fluid: 50 %
Other Cells, CSF: 0
Other Cells, CSF: 0
RBC Count, CSF: 164 /mm3 — ABNORMAL HIGH (ref 0–3)
RBC Count, CSF: 935 /mm3 — ABNORMAL HIGH (ref 0–3)
Segmented Neutrophils-CSF: 30 %
Segmented Neutrophils-CSF: 60 %
Tube #: 1
WBC, CSF: 23 /mm3 (ref 0–5)
WBC, CSF: 23 /mm3 (ref 0–5)

## 2019-02-22 LAB — TROPONIN I: Troponin I: <0.03 ng/mL (ref <0.03)

## 2019-02-22 LAB — CBC WITH DIFFERENTIAL/PLATELET
Abs Immature Granulocytes: 0.05 10*3/uL (ref 0.00–0.07)
Basophils Absolute: 0 10*3/uL (ref 0.0–0.1)
Basophils Relative: 0 %
Eosinophils Absolute: 0.1 10*3/uL (ref 0.0–0.5)
Eosinophils Relative: 1 %
HCT: 44.9 % (ref 39.0–52.0)
Hemoglobin: 15.9 g/dL (ref 13.0–17.0)
Immature Granulocytes: 0 %
Lymphocytes Relative: 15 %
Lymphs Abs: 1.7 10*3/uL (ref 0.7–4.0)
MCH: 30.5 pg (ref 26.0–34.0)
MCHC: 35.4 g/dL (ref 30.0–36.0)
MCV: 86.2 fL (ref 80.0–100.0)
Monocytes Absolute: 1.1 10*3/uL — ABNORMAL HIGH (ref 0.1–1.0)
Monocytes Relative: 10 %
Neutro Abs: 8.4 10*3/uL — ABNORMAL HIGH (ref 1.7–7.7)
Neutrophils Relative %: 74 %
Platelets: 322 10*3/uL (ref 150–400)
RBC: 5.21 MIL/uL (ref 4.22–5.81)
RDW: 12.2 % (ref 11.5–15.5)
WBC: 11.4 10*3/uL — ABNORMAL HIGH (ref 4.0–10.5)
nRBC: 0 % (ref 0.0–0.2)

## 2019-02-22 LAB — CK: Total CK: 875 U/L — ABNORMAL HIGH (ref 49–397)

## 2019-02-22 LAB — ETHANOL
Alcohol, Ethyl (B): 58 mg/dL — ABNORMAL HIGH (ref <10)
Alcohol, Ethyl (B): <10 mg/dL (ref <10)

## 2019-02-22 LAB — PROTEIN AND GLUCOSE, CSF
Glucose, CSF: 71 mg/dL — ABNORMAL HIGH (ref 40–70)
Total  Protein, CSF: 35 mg/dL (ref 15–45)

## 2019-02-22 LAB — LACTIC ACID, PLASMA: Lactic Acid, Venous: 1.3 mmol/L (ref 0.5–1.9)

## 2019-02-22 MED ORDER — LORAZEPAM 2 MG/ML IJ SOLN
INTRAMUSCULAR | Status: AC
  Filled 2019-02-22: qty 1

## 2019-02-22 MED ORDER — HALOPERIDOL LACTATE 5 MG/ML IJ SOLN
5.0000 mg | Freq: Once | INTRAMUSCULAR | Status: AC
  Administered 2019-02-22: 5 mg via INTRAMUSCULAR
  Filled 2019-02-22: qty 1

## 2019-02-22 MED ORDER — SODIUM CHLORIDE 0.9 % IV BOLUS
1000.0000 mL | Freq: Once | INTRAVENOUS | Status: AC
  Administered 2019-02-22: 1000 mL via INTRAVENOUS

## 2019-02-22 MED ORDER — DIPHENHYDRAMINE HCL 50 MG/ML IJ SOLN
INTRAMUSCULAR | Status: AC
  Filled 2019-02-22: qty 1

## 2019-02-22 MED ORDER — BENZTROPINE MESYLATE 1 MG/ML IJ SOLN
2.0000 mg | Freq: Once | INTRAMUSCULAR | Status: AC
  Administered 2019-02-22: 2 mg via INTRAVENOUS
  Filled 2019-02-22: qty 2

## 2019-02-22 MED ORDER — DIPHENHYDRAMINE HCL 50 MG/ML IJ SOLN
50.0000 mg | Freq: Once | INTRAMUSCULAR | Status: AC
  Administered 2019-02-22: 50 mg via INTRAVENOUS

## 2019-02-22 MED ORDER — VANCOMYCIN HCL IN DEXTROSE 1-5 GM/200ML-% IV SOLN
1000.0000 mg | Freq: Two times a day (BID) | INTRAVENOUS | Status: DC
  Administered 2019-02-23: 1000 mg via INTRAVENOUS
  Filled 2019-02-22 (×2): qty 200

## 2019-02-22 MED ORDER — SODIUM CHLORIDE 0.9 % IV BOLUS: 1000.0000 mL | Freq: Once | INTRAVENOUS | Status: DC

## 2019-02-22 MED ORDER — LORAZEPAM 2 MG/ML IJ SOLN
1.0000 mg | Freq: Once | INTRAMUSCULAR | Status: AC
  Administered 2019-02-22: 1 mg via INTRAVENOUS

## 2019-02-22 MED ORDER — SODIUM CHLORIDE 0.9 % IV SOLN
2.0000 g | Freq: Two times a day (BID) | INTRAVENOUS | Status: DC
  Administered 2019-02-23: 2 g via INTRAVENOUS
  Filled 2019-02-22: qty 20
  Filled 2019-02-22: qty 2

## 2019-02-22 MED ORDER — LIDOCAINE HCL (PF) 1 % IJ SOLN
INTRAMUSCULAR | Status: AC
  Administered 2019-02-22: 20:00:00
  Filled 2019-02-22: qty 5

## 2019-02-22 MED ORDER — LIDOCAINE-PRILOCAINE 2.5-2.5 % EX CREA
TOPICAL_CREAM | CUTANEOUS | Status: AC
  Administered 2019-02-22: 20:00:00
  Filled 2019-02-22: qty 5

## 2019-02-22 MED ORDER — MIDAZOLAM HCL 2 MG/2ML IJ SOLN
4.0000 mg | Freq: Once | INTRAMUSCULAR | Status: AC
  Administered 2019-02-22: 2 mg via INTRAMUSCULAR
  Filled 2019-02-22: qty 4

## 2019-02-22 MED ORDER — SODIUM CHLORIDE 0.9 % IV SOLN
2.0000 g | Freq: Once | INTRAVENOUS | Status: DC
  Administered 2019-02-22: 2 g via INTRAVENOUS
  Filled 2019-02-22: qty 20

## 2019-02-22 MED ORDER — DEXAMETHASONE SODIUM PHOSPHATE 10 MG/ML IJ SOLN
10.0000 mg | Freq: Once | INTRAMUSCULAR | Status: AC
  Administered 2019-02-22: 10 mg via INTRAVENOUS
  Filled 2019-02-22: qty 1

## 2019-02-22 MED ORDER — LORAZEPAM 2 MG/ML IJ SOLN: 2.0000 mg | Freq: Once | INTRAMUSCULAR | Status: DC

## 2019-02-22 MED ORDER — VANCOMYCIN HCL IN DEXTROSE 1-5 GM/200ML-% IV SOLN
1000.0000 mg | Freq: Once | INTRAVENOUS | Status: AC
  Administered 2019-02-22: 1000 mg via INTRAVENOUS
  Filled 2019-02-22: qty 200

## 2019-02-22 MED ORDER — LORAZEPAM 2 MG/ML IJ SOLN
2.0000 mg | Freq: Once | INTRAMUSCULAR | Status: AC
  Administered 2019-02-22: 2 mg via INTRAVENOUS

## 2019-02-22 NOTE — ED Notes (Signed)
Pt given meal tray.

## 2019-02-22 NOTE — ED Notes (Signed)
Patients mother called to check on patient.  Mother informed patient is sleeping.  Mother wanted patient to call her in morning when available.

## 2019-02-22 NOTE — ED Notes (Signed)
Attempted to call report at this time. Per 2A pt may be going to other floor.

## 2019-02-22 NOTE — H&P (Signed)
[facility] - [place] at [facility]   PATIENT NAME: Seth Luna    MR#:  [id number]  DATE OF BIRTH:  12/13/1995  DATE OF ADMISSION:  02/22/2019  PRIMARY CARE PHYSICIAN: Pa, [facility]   REQUESTING/REFERRING PHYSICIAN: Don Perking, MD  CHIEF COMPLAINT:  Dystonic reaction  HISTORY OF PRESENT ILLNESS:  Seth Luna  is a 23 y.o. male who presents with chief complaint as above.  Patient presents to the ED with bizarre sequence of waxing and waning mental status changes.  He presented initially last night to the ED and was hallucinating and somewhat agitated per the notes.  He received some medication including Haldol per report.  He calmed down and was able to be sent home.  Last night he told physicians in the ED that he felt that he was likely experiencing this reaction due to methamphetamine use.  He returned today to the ED with what is likely a dystonic reaction, with muscle spasms.  He expressed to ED physician today that he had not used any methamphetamine for the past 2 weeks.  He was given medication for his suspected dystonic reaction, and then again became hallucinatory and confused.  ED physician was concerned perhaps for the possibility of meningitis, and so spinal tap was performed and CSF sent for evaluation.  Hospitalist were called for admission  PAST MEDICAL HISTORY:   Past Medical History:  Diagnosis Date  . ADHD (attention deficit hyperactivity disorder)   . Substance abuse (HCC)      PAST SURGICAL HISTORY:   Past Surgical History:  Procedure Laterality Date  . FOOT SURGERY       SOCIAL HISTORY:   Social History   Tobacco Use  . Smoking status: Current Some Day Smoker  . Smokeless tobacco: Never Used  Substance Use Topics  . Alcohol use: Yes     FAMILY HISTORY:    Family history reviewed and is non-contributory DRUG ALLERGIES:   Allergies  Allergen Reactions  . Haldol [Haloperidol Lactate] Other (See  Comments)    Dystonic reaction    MEDICATIONS AT HOME:   Prior to Admission medications   Not on File    REVIEW OF SYSTEMS:  Review of Systems  Constitutional: Negative for chills, fever, malaise/fatigue and weight loss.  HENT: Negative for ear pain, hearing loss and tinnitus.   Eyes: Negative for blurred vision, double vision, pain and redness.  Respiratory: Negative for cough, hemoptysis and shortness of breath.   Cardiovascular: Negative for chest pain, palpitations, orthopnea and leg swelling.  Gastrointestinal: Negative for abdominal pain, constipation, diarrhea, nausea and vomiting.  Genitourinary: Negative for dysuria, frequency and hematuria.  Musculoskeletal: Negative for back pain, joint pain and neck pain.       Muscle spasms  Skin:       No acne, rash, or lesions  Neurological: Negative for dizziness, tremors, focal weakness and weakness.       Confusion  Endo/Heme/Allergies: Negative for polydipsia. Does not bruise/bleed easily.  Psychiatric/Behavioral: Negative for depression. The patient is not nervous/anxious and does not have insomnia.      VITAL SIGNS:   Vitals:   02/22/19 1800 02/22/19 1813 02/22/19 1830 02/22/19 1930  BP: 134/89 (!) 133/120 137/84 140/86  Pulse: 99 (!) 120 92 78  Resp: 20 (!) 22 (!) 21   Temp:  98.6 F (37 C)    TempSrc:  Oral    SpO2: 100% 100% 98% 99%   Wt Readings from Last 3 Encounters:  02/22/19 52.2  kg  12/25/18 56.7 kg  12/25/18 56.7 kg    PHYSICAL EXAMINATION:  Physical Exam  Vitals reviewed. Constitutional: He is oriented to person, place, and time. He appears well-developed and well-nourished. No distress.  HENT:  Head: Normocephalic and atraumatic.  Mouth/Throat: Oropharynx is clear and moist.  Eyes: Pupils are equal, round, and reactive to light. Conjunctivae and EOM are normal. No scleral icterus.  Neck: Normal range of motion. Neck supple. No JVD present. No thyromegaly present.  Cardiovascular: Normal rate,  regular rhythm and intact distal pulses. Exam reveals no gallop and no friction rub.  No murmur heard. Respiratory: Effort normal and breath sounds normal. No respiratory distress. He has no wheezes. He has no rales.  GI: Soft. Bowel sounds are normal. He exhibits no distension. There is no abdominal tenderness.  Musculoskeletal: Normal range of motion.        General: No edema.     Comments: No arthritis, no gout  Lymphadenopathy:    He has no cervical adenopathy.  Neurological: He is alert and oriented to person, place, and time. No cranial nerve deficit.  No dysarthria, no aphasia  Skin: Skin is warm and dry. No rash noted. No erythema.  Psychiatric: He has a normal mood and affect. His behavior is normal. Judgment and thought content normal.    LABORATORY PANEL:   CBC Recent Labs  Lab 02/22/19 1741  WBC 11.4*  HGB 15.9  HCT 44.9  PLT 322   ------------------------------------------------------------------------------------------------------------------  Chemistries  Recent Labs  Lab 02/22/19 1741  NA 137  K 3.8  CL 99  CO2 24  GLUCOSE 113*  BUN 10  CREATININE 0.75  CALCIUM 9.0  AST 44*  ALT 22  ALKPHOS 52  BILITOT 1.3*   ------------------------------------------------------------------------------------------------------------------  Cardiac Enzymes Recent Labs  Lab 02/22/19 1741  TROPONINI <0.03   ------------------------------------------------------------------------------------------------------------------  RADIOLOGY:  Ct Head Wo Contrast  Result Date: 02/22/2019 CLINICAL DATA:  Encephalopathy, tremors, gaze and head deviation to the LEFT EXAM: CT HEAD WITHOUT CONTRAST TECHNIQUE: Contiguous axial images were obtained from the base of the skull through the vertex without intravenous contrast. Sagittal and coronal MPR images reconstructed from axial data set. COMPARISON:  04/15/2016 FINDINGS: Brain: Normal ventricular morphology. No midline shift or  mass effect. Normal appearance of brain parenchyma. No intracranial hemorrhage, mass lesion, evidence of acute infarction, or extra-axial fluid collection. Vascular: No hyperdense vessels Skull: Intact Sinuses/Orbits: Clear Other: N/A IMPRESSION: Normal exam. Electronically Signed   By: Ulyses SouthwardMark  Boles M.D.   On: 02/22/2019 18:20    EKG:   Orders placed or performed during the hospital encounter of 02/22/19  . ED EKG  . ED EKG    IMPRESSION AND PLAN:  Principal Problem:   Acute encephalopathy -patient is alert and oriented on this writer's interview.  ED physician saw the patient again shortly after this writer's interview and the patient was hallucinating and speaking to his television.  He has a significant waxing and waning phase of this encephalopathy, with unclear absolute etiology at this point.  We will keep him on antibiotics empirically for possible meningitis and await CSF cultures.  We will keep him hydrated and monitor him closely and observe for any improvement in his symptoms in the morning.  Urine tox screen still pending Active Problems:   Dystonic drug reaction -now resolved after administration of Cogentin and Benadryl and Ativan in the ED.  Monitor for any return of symptoms   Substance abuse (HCC) -patient admits to methamphetamine use, counseled  on substance abuse cessation, though it is unclear what the patient's absolute mental status at this time with his waxing and waning encephalopathy.  Chart review performed and case discussed with ED provider. Labs, imaging and/or ECG reviewed by provider and discussed with patient/family. Management plans discussed with the patient and/or family.  DVT PROPHYLAXIS: SubQ lovenox   GI PROPHYLAXIS:  None  ADMISSION STATUS: Inpatient     CODE STATUS: Full  TOTAL TIME TAKING CARE OF THIS PATIENT: 45 minutes.   Barney Drain 02/22/2019, 10:14 PM  [facility]  Office  [phone]  CC: Primary care physician; Pa,  [facility]  Note:  This document was prepared using Dragon voice recognition software and may include unintentional dictation errors.

## 2019-02-22 NOTE — ED Provider Notes (Signed)
[facility] Emergency Department Provider Note  ____________________________________________  Time seen: Approximately 5:50 PM  I have reviewed the triage vital signs and the nursing notes.   HISTORY  Chief Complaint No chief complaint on file.   HPI Seth Luna is a 23 y.o. male with history of ADHD who presents for evaluation of a dystonic reaction.  Patient was seen here yesterday for psychosis in the setting of methamphetamine and alcohol abuse.  He received IM Versed and Haldol.  Was discharged this morning.  This afternoon patient started having involuntary severe muscle contractions that are happening every several minutes.  Patient denies ever having anything similar in the past.  He is not sure if he is ever gotten Haldol in the past.  He denies any drug use within the last 2 weeks.  Last alcohol was yesterday prior to coming to the emergency room.  He denies fever, neck stiffness, headache, nausea, vomiting.  Past Medical History:  Diagnosis Date  . ADHD (attention deficit hyperactivity disorder)     There are no active problems to display for this patient.   Past Surgical History:  Procedure Laterality Date  . FOOT SURGERY      Prior to Admission medications   Not on File    Allergies Haldol [haloperidol lactate]  History reviewed. No pertinent family history.  Social History Social History   Tobacco Use  . Smoking status: Current Some Day Smoker  . Smokeless tobacco: Never Used  Substance Use Topics  . Alcohol use: Yes  . Drug use: Yes    Types: Marijuana    Review of Systems  Constitutional: Negative for fever. Eyes: Negative for visual changes. ENT: Negative for sore throat. Neck: No neck pain  Cardiovascular: Negative for chest pain. Respiratory: Negative for shortness of breath. Gastrointestinal: Negative for abdominal pain, vomiting or diarrhea. Genitourinary: Negative for dysuria. Musculoskeletal: Negative  for back pain. + involuntary muscle contractions Skin: Negative for rash. Neurological: Negative for headaches, weakness or numbness. Psych: No SI or HI  ____________________________________________   PHYSICAL EXAM:  VITAL SIGNS: Vitals:   02/22/19 1830 02/22/19 1930  BP: 137/84 140/86  Pulse: 92 78  Resp: (!) 21   Temp:    SpO2: 98% 99%    Constitutional: Alert and oriented.  HEENT:      Head: Normocephalic and atraumatic.         Eyes: Conjunctivae are normal. Sclera is non-icteric.       Mouth/Throat: Mucous membranes are moist.       Neck: Supple with no signs of meningismus. Cardiovascular: Tachycardic with regular rhythm. No murmurs, gallops, or rubs. 2+ symmetrical distal pulses are present in all extremities. No JVD. Respiratory: Normal respiratory effort. Lungs are clear to auscultation bilaterally. No wheezes, crackles, or rhonchi.  Gastrointestinal: Soft, non tender, and non distended with positive bowel sounds. No rebound or guarding. Musculoskeletal: Nontender with normal range of motion in all extremities. No edema, cyanosis, or erythema of extremities. Neurologic: Normal speech and language. Patient having repetitive neck and arms muscle contractions with deviation of the head to the L lasting a few minutes at a time. Also repetitive rubbing of his feet. Patient is awake and alert during these episodes Skin: Skin is warm, dry and intact. No rash noted. Psychiatric: Mood and affect are normal. Speech and behavior are normal.  ____________________________________________   LABS (all labs ordered are listed, but only abnormal results are displayed)  Labs Reviewed  CBC WITH DIFFERENTIAL/PLATELET - Abnormal;  Notable for the following components:      Result Value   WBC 11.4 (*)    Neutro Abs 8.4 (*)    Monocytes Absolute 1.1 (*)    All other components within normal limits  COMPREHENSIVE METABOLIC PANEL - Abnormal; Notable for the following components:    Glucose, Bld 113 (*)    AST 44 (*)    Total Bilirubin 1.3 (*)    All other components within normal limits  CK - Abnormal; Notable for the following components:   Total CK 875 (*)    All other components within normal limits  PROTEIN AND GLUCOSE, CSF - Abnormal; Notable for the following components:   Glucose, CSF 71 (*)    All other components within normal limits  CSF CULTURE  CULTURE, BLOOD (ROUTINE X 2)  CULTURE, BLOOD (ROUTINE X 2)  TROPONIN I  LACTIC ACID, PLASMA  ETHANOL  URINE DRUG SCREEN, QUALITATIVE ([facility] ONLY)  CSF CELL COUNT WITH DIFFERENTIAL  CSF CELL COUNT WITH DIFFERENTIAL  HERPES SIMPLEX VIRUS(HSV) DNA BY PCR   ____________________________________________  EKG  ED ECG REPORT I, Nita Sickle, the attending physician, personally viewed and interpreted this ECG.  Normal sinus rhythm, rate of 99, normal intervals, normal axis, no ST elevations or depressions.  Normal EKG. ____________________________________________  RADIOLOGY  I have personally reviewed the images performed during this visit and I agree with the Radiologist's read.   Interpretation by Radiologist:  Ct Head Wo Contrast  Result Date: 02/22/2019 CLINICAL DATA:  Encephalopathy, tremors, gaze and head deviation to the LEFT EXAM: CT HEAD WITHOUT CONTRAST TECHNIQUE: Contiguous axial images were obtained from the base of the skull through the vertex without intravenous contrast. Sagittal and coronal MPR images reconstructed from axial data set. COMPARISON:  04/15/2016 FINDINGS: Brain: Normal ventricular morphology. No midline shift or mass effect. Normal appearance of brain parenchyma. No intracranial hemorrhage, mass lesion, evidence of acute infarction, or extra-axial fluid collection. Vascular: No hyperdense vessels Skull: Intact Sinuses/Orbits: Clear Other: N/A IMPRESSION: Normal exam. Electronically Signed   By: Ulyses Southward M.D.   On: 02/22/2019 18:20       ____________________________________________   PROCEDURES  Procedure(s) performed:yes .Lumbar Puncture Date/Time: 02/22/2019 8:19 PM Performed by: Nita Sickle, MD Authorized by: Nita Sickle, MD   Consent:    Consent obtained:  Emergent situation Pre-procedure details:    Procedure purpose:  Diagnostic   Preparation: Patient was prepped and draped in usual sterile fashion   Sedation:    Sedation type:  Anxiolysis Anesthesia (see MAR for exact dosages):    Anesthesia method:  Local infiltration   Local anesthetic:  Lidocaine 1% w/o epi Procedure details:    Lumbar space:  L3-L4 interspace   Patient position:  R lateral decubitus   Needle gauge:  22   Needle type:  Spinal needle - Quincke tip   Number of attempts:  1   Fluid appearance:  Blood-tinged   Tubes of fluid:  4 Post-procedure:    Puncture site:  Adhesive bandage applied and direct pressure applied   Patient tolerance of procedure:  Tolerated well, no immediate complications   Critical Care performed: yes  CRITICAL CARE Performed by: Nita Sickle  ?  Total critical care time: 35 min  Critical care time was exclusive of separately billable procedures and treating other patients.  Critical care was necessary to treat or prevent imminent or life-threatening deterioration.  Critical care was time spent personally by me on the following activities: development of treatment plan  with patient and/or surrogate as well as nursing, discussions with consultants, evaluation of patient's response to treatment, examination of patient, obtaining history from patient or surrogate, ordering and performing treatments and interventions, ordering and review of laboratory studies, ordering and review of radiographic studies, pulse oximetry and re-evaluation of patient's condition.  ____________________________________________   INITIAL IMPRESSION / ASSESSMENT AND PLAN / ED COURSE  23 y.o. male with history of  ADHD who presents for evaluation of repetitive involuntary muscle contractions concerning for dystonic reaction after receiving haldol 12 hour ago for psychosis. No fever, no meningeal signs, no trauma, no neck pain or stiffness, no HA, no fever. Although less likely meningitis also a possibility. Patient given IV benadryl with no improvement, then given 2mg  of IV ativan with improvement of his symptoms. Will start patient on cogentin. Head CT with no evidence of trauma. Labs pending to eval for electrolyte abnormalities, dehydration, lactic acidosis, rhabdo. Will give IVF. Patient being monitored carefully.   Clinical Course as of Feb 21 2138  Sat Feb 22, 2019  [id number] After Cogentin patient symptoms fully resolved however patient is hallucinating in the room, seeing things that are not there, talking to his mother who is not there.  Pupils are 4 mm bilaterally and reactive.  Patient will undergo spinal tap to rule out meningitis.   [CV]    Clinical Course User Index [CV] Nita SickleVeronese, [place], MD   _________________________ 8:20 PM on 02/22/2019 -----------------------------------------  Patient still hallucinating. LP done. Fluid blood tinged. Studies pending. Abx and decadron given for possible meningitis.   _________________________ 9:37 PM on 02/22/2019 -----------------------------------------  No organisms seen on Gram stain.  Cell count is still pending.  Slightly elevated glucose.  Patient remains with hallucination and responding to external stimuli.  Will admit to the hospitalist for further evaluation.  At this time most likely dystonic reaction to Haldol but we need to clear him from the meningitis perspective.  As part of my medical decision making, I reviewed the following data within the electronic MEDICAL RECORD NUMBER Nursing notes reviewed and incorporated, Labs reviewed , EKG interpreted , Old EKG reviewed, Old chart reviewed, Radiograph reviewed , Discussed with admitting physician ,  Notes from prior ED visits and [place] Controlled Substance Database    Pertinent labs & imaging results that were available during my care of the patient were reviewed by me and considered in my medical decision making (see chart for details).    ____________________________________________   FINAL CLINICAL IMPRESSION(S) / ED DIAGNOSES  Final diagnoses:  Acute dystonic reaction due to drugs  Hallucinations      NEW MEDICATIONS STARTED DURING THIS VISIT:  ED Discharge Orders    None       Note:  This document was prepared using Dragon voice recognition software and may include unintentional dictation errors.    Nita SickleVeronese, [place], MD 02/22/19 2139

## 2019-02-22 NOTE — ED Notes (Signed)
TTS consulted with the pts nurse to reevaluate pts appropriateness  for assessment. Pt has been give to IM injections due to AMS, hallucinations  and agitation. TTS made aware that pt is sedated and not appropriate for assessment at this time. Pt will be as when he is alert and oriented.

## 2019-02-22 NOTE — ED Triage Notes (Addendum)
Pt brought to ED by parents; pt keeps saying "I'm scared"; anxious and tearful; having difficulty getting patient to answer any questions; follows commands but will not provide any information about what brings him in tonight; then pt says he smoked meth 14 days ago; mom reported pt has been having hallucinations

## 2019-02-22 NOTE — Discharge Instructions (Addendum)
Please do not drink alcohol to excess or use drugs.  Remember that you were having all kind of hallucinations last night when you came in.  That can be dangerous.  Alcoholics Anonymous can help if you need that.

## 2019-02-22 NOTE — ED Notes (Signed)
Date and time results received: 02/22/19 2327 (use smartphrase ".now" to insert current time)  Test: CSF WBC Critical Value: 23  Name of Provider Notified: Anne Hahn  Orders Received? Or Actions Taken?: notified

## 2019-02-22 NOTE — ED Provider Notes (Signed)
[facility] Emergency Department Provider Note    First MD Initiated Contact with Patient 02/22/19 0128     (approximate)  I have reviewed the triage vital signs and the nursing notes.   HISTORY  Chief Complaint Medical Clearance  Level V Caveat:  ams  HPI Seth Luna is a 23 y.o. male presents the ER reportedly after using methamphetamines with development of hallucinations.  Nausea and anxiety.  Patient unable to provide much history other than stating "I am scared.  "Patient thrashing about in the ER scratching himself.  Patient tearful.    Denies any pain nausea or vomiting but unable to provide any additional history.   Past Medical History:  Diagnosis Date  . ADHD (attention deficit hyperactivity disorder)    No family history on file. Past Surgical History:  Procedure Laterality Date  . FOOT SURGERY     There are no active problems to display for this patient.     Prior to Admission medications   Medication Sig Start Date End Date Taking? Authorizing Provider  amphetamine-dextroamphetamine (ADDERALL) 10 MG tablet Take 1 tablet by mouth daily. 03/17/16   [provider]  ondansetron (ZOFRAN ODT) 4 MG disintegrating tablet Take 1 tablet (4 mg total) by mouth every 8 (eight) hours as needed for nausea or vomiting. 12/25/18   Melene PlanFloyd, Dan, DO  polyethylene glycol powder (GLYCOLAX/MIRALAX) powder Take 17 g by mouth daily. 03/17/16   [provider]  VYVANSE 50 MG capsule Take 1 capsule by mouth every morning. 03/17/16   [provider]    Allergies Patient has no known allergies.    Social History Social History   Tobacco Use  . Smoking status: Current Some Day Smoker  . Smokeless tobacco: Never Used  Substance Use Topics  . Alcohol use: Yes  . Drug use: Yes    Types: Marijuana    Review of Systems Patient denies headaches, rhinorrhea, blurry vision, numbness, shortness of breath, chest pain, edema,  cough, abdominal pain, nausea, vomiting, diarrhea, dysuria, fevers, rashes or hallucinations unless otherwise stated above in HPI. ____________________________________________   PHYSICAL EXAM:  VITAL SIGNS: Vitals:   02/22/19 0027  BP: (!) 156/101  Pulse: (!) 122  Resp: 20  Temp: 98 F (36.7 C)  SpO2: 100%    Constitutional: Alert, tearful, anxious, paranoid Eyes: Conjunctivae are normal.  Head: Atraumatic. Nose: No congestion/rhinnorhea. Mouth/Throat: Mucous membranes are moist.   Neck: No stridor. Painless ROM.  Cardiovascular: Normal rate, regular rhythm. Grossly normal heart sounds.  Good peripheral circulation. Respiratory: Normal respiratory effort.  No retractions. Lungs CTAB. Gastrointestinal: Soft and nontender. No distention. No abdominal bruits. No CVA tenderness. Genitourinary:  Musculoskeletal: No lower extremity tenderness nor edema.  No joint effusions. Neurologic:  Normal speech and language. No gross focal neurologic deficits are appreciated. No facial droop Skin:  Skin is warm, dry and intact. No rash noted. Psychiatric: paranoid, anxious tearful ____________________________________________   LABS (all labs ordered are listed, but only abnormal results are displayed)  Results for orders placed or performed during the hospital encounter of 02/22/19 (from the past 24 hour(s))  Comprehensive metabolic panel     Status: Abnormal   Collection Time: 02/22/19 12:43 AM  Result Value Ref Range   Sodium 139 135 - 145 mmol/L   Potassium 3.0 (L) 3.5 - 5.1 mmol/L   Chloride 100 98 - 111 mmol/L   CO2 24 22 - 32 mmol/L   Glucose, Bld 108 (H) 70 - 99 mg/dL  BUN 6 6 - 20 mg/dL   Creatinine, Ser 7.32 0.61 - 1.24 mg/dL   Calcium 9.5 8.9 - 20.2 mg/dL   Total Protein 8.2 (H) 6.5 - 8.1 g/dL   Albumin 4.6 3.5 - 5.0 g/dL   AST 22 15 - 41 U/L   ALT 21 0 - 44 U/L   Alkaline Phosphatase 58 38 - 126 U/L   Total Bilirubin 0.7 0.3 - 1.2 mg/dL   GFR calc non Af Amer >60 >60  mL/min   GFR calc Af Amer >60 >60 mL/min   Anion gap 15 5 - 15  Ethanol     Status: Abnormal   Collection Time: 02/22/19 12:43 AM  Result Value Ref Range   Alcohol, Ethyl (B) 58 (H) <10 mg/dL  cbc     Status: Abnormal   Collection Time: 02/22/19 12:43 AM  Result Value Ref Range   WBC 7.0 4.0 - 10.5 K/uL   RBC 5.84 (H) 4.22 - 5.81 MIL/uL   Hemoglobin 17.7 (H) 13.0 - 17.0 g/dL   HCT 54.2 70.6 - 23.7 %   MCV 84.9 80.0 - 100.0 fL   MCH 30.3 26.0 - 34.0 pg   MCHC 35.7 30.0 - 36.0 g/dL   RDW 62.8 31.5 - 17.6 %   Platelets 338 150 - 400 K/uL   nRBC 0.0 0.0 - 0.2 %   ____________________________________________ ____________________________________________  ____________________________________________   PROCEDURES  Procedure(s) performed:  .Critical Care Performed by: Willy Eddy, MD Authorized by: Willy Eddy, MD   Critical care provider statement:    Critical care time (minutes):  30   Critical care time was exclusive of:  Separately billable procedures and treating other patients   Critical care was necessary to treat or prevent imminent or life-threatening deterioration of the following conditions:  Toxidrome   Critical care was time spent personally by me on the following activities:  Development of treatment plan with patient or surrogate, discussions with consultants, evaluation of patient's response to treatment, examination of patient, obtaining history from patient or surrogate, ordering and performing treatments and interventions, ordering and review of laboratory studies, ordering and review of radiographic studies, pulse oximetry, re-evaluation of patient's condition and review of old charts      Critical Care performed: yes ____________________________________________   INITIAL IMPRESSION / ASSESSMENT AND PLAN / ED COURSE  Pertinent labs & imaging results that were available during my care of the patient were reviewed by me and considered in my  medical decision making (see chart for details).   DDX: Psychosis, delirium, medication effect, noncompliance, polysubstance abuse, Si, Hi, depression   Seth Luna is a 23 y.o. who presents to the ED with for evaluation of symptoms as described above..  Patient has psych history of DHD and reportedly has been doing meth.  Patient with acute agitated delirium.  Due to his agitation will require calming medication in the form of IM Haldol and Versed..  Laboratory testing was ordered to evaluation for underlying electrolyte derangement or signs of underlying organic pathology to explain today's presentation.  Based on history and physical and laboratory evaluation, it appears that the patient's presentation is 2/2 underlying psychiatric disorder and will require further evaluation and management by inpatient psychiatry.  Patient was  made an IVC due to agitated paranoia.  Disposition pending psychiatric evaluation.   Clinical Course as of Feb 21 526  Sat Feb 22, 2019  1607 Patient reassessed.  Resting comfortably.   [PR]    Clinical Course User  Index [PR] Willy Eddy, MD    The patient was evaluated in Emergency Department today for the symptoms described in the history of present illness. He/she was evaluated in the context of the global COVID-19 pandemic, which necessitated consideration that the patient might be at risk for infection with the SARS-CoV-2 virus that causes COVID-19. Institutional protocols and algorithms that pertain to the evaluation of patients at risk for COVID-19 are in a state of rapid change based on information released by regulatory bodies including the CDC and federal and state organizations. These policies and algorithms were followed during the patient's care in the ED.  As part of my medical decision making, I reviewed the following data within the electronic MEDICAL RECORD NUMBER Nursing notes reviewed and incorporated, Labs reviewed, notes from prior ED visits  and [place] Controlled Substance Database   ____________________________________________   FINAL CLINICAL IMPRESSION(S) / ED DIAGNOSES  Final diagnoses:  Acute paranoia (HCC)      NEW MEDICATIONS STARTED DURING THIS VISIT:  New Prescriptions   No medications on file     Note:  This document was prepared using Dragon voice recognition software and may include unintentional dictation errors.    Willy Eddy, MD 02/22/19 [phone]

## 2019-02-22 NOTE — ED Notes (Signed)
ED TO INPATIENT HANDOFF REPORT  ED Nurse Name and Phone #: Berline Lopesgracie [phone]  S Name/Age/Gender Seth Luna 23 y.o. male Room/Bed: ED19A/ED19A  Code Status   Code Status: Not on file  Home/SNF/Other Home Patient oriented to: self, place, time and situation Is this baseline? Yes   Triage Complete: Triage complete  Chief Complaint Contracture  Triage Note No notes on file   Allergies Allergies  Allergen Reactions  . Haldol [Haloperidol Lactate] Other (See Comments)    Dystonic reaction    Level of Care/Admitting Diagnosis ED Disposition    ED Disposition Condition Comment   Admit  [facility] [[id number]]  Level of Care: Med-Surg [16]  Covid Evaluation: N/A  Diagnosis: Acute encephalopathy [[id number]]  Admitting Physician: Oralia ManisWILLIS, DAVID [[id number]]  Attending Physician: Oralia ManisWILLIS, DAVID [phone][[id number]]  Estimated length of stay: past midnight tomorrow  Certification:: I certify this patient will need inpatient services for at least 2 midnights  PT Class (Do Not Modify): Inpatient [101]  PT Acc Code (Do Not Modify): Private [1]       B Medical/Surgery History Past Medical History:  Diagnosis Date  . ADHD (attention deficit hyperactivity disorder)   . Substance abuse [facility](HCC)    Past Surgical History:  Procedure Laterality Date  . FOOT SURGERY       A IV Location/Drains/Wounds Patient Lines/Drains/Airways Status   Active Line/Drains/Airways    Name:   Placement date:   Placement time:   Site:   Days:   Peripheral IV 02/22/19 Right Hand   02/22/19    -    Hand   less than 1          Intake/Output Last 24 hours  Intake/Output Summary (Last 24 hours) at 02/22/2019 2241 Last data filed at 02/22/2019 2241 Gross per 24 hour  Intake 300.02 ml  Output -  Net 300.02 ml    Labs/Imaging Results for orders placed or performed during the hospital encounter of 02/22/19 (from the past 48 hour(s))  CBC with Differential/Platelet      Status: Abnormal   Collection Time: 02/22/19  5:41 PM  Result Value Ref Range   WBC 11.4 (H) 4.0 - 10.5 K/uL   RBC 5.21 4.22 - 5.81 MIL/uL   Hemoglobin 15.9 13.0 - 17.0 g/dL   HCT 62.944.9 52.839.0 - 41.352.0 %   MCV 86.2 80.0 - 100.0 fL   MCH 30.5 26.0 - 34.0 pg   MCHC 35.4 30.0 - 36.0 g/dL   RDW 24.412.2 01.011.5 - 27.215.5 %   Platelets 322 150 - 400 K/uL   nRBC 0.0 0.0 - 0.2 %   Neutrophils Relative % 74 %   Neutro Abs 8.4 (H) 1.7 - 7.7 K/uL   Lymphocytes Relative 15 %   Lymphs Abs 1.7 0.7 - 4.0 K/uL   Monocytes Relative 10 %   Monocytes Absolute 1.1 (H) 0.1 - 1.0 K/uL   Eosinophils Relative 1 %   Eosinophils Absolute 0.1 0.0 - 0.5 K/uL   Basophils Relative 0 %   Basophils Absolute 0.0 0.0 - 0.1 K/uL   Immature Granulocytes 0 %   Abs Immature Granulocytes 0.05 0.00 - 0.07 K/uL    Comment: Performed at [facility], [address] [place], [place] [address]  Comprehensive metabolic panel     Status: Abnormal   Collection Time: 02/22/19  5:41 PM  Result Value Ref Range   Sodium 137 135 - 145 mmol/L   Potassium 3.8 3.5 - 5.1 mmol/L  Comment: HEMOLYSIS AT THIS LEVEL MAY AFFECT RESULT   Chloride 99 98 - 111 mmol/L   CO2 24 22 - 32 mmol/L   Glucose, Bld 113 (H) 70 - 99 mg/dL   BUN 10 6 - 20 mg/dL   Creatinine, Ser 1.77 0.61 - 1.24 mg/dL   Calcium 9.0 8.9 - 11.6 mg/dL   Total Protein 7.6 6.5 - 8.1 g/dL   Albumin 4.4 3.5 - 5.0 g/dL   AST 44 (H) 15 - 41 U/L   ALT 22 0 - 44 U/L   Alkaline Phosphatase 52 38 - 126 U/L   Total Bilirubin 1.3 (H) 0.3 - 1.2 mg/dL   GFR calc non Af Amer >60 >60 mL/min   GFR calc Af Amer >60 >60 mL/min   Anion gap 14 5 - 15    Comment: Performed at [facility], [address] [place], [place] [address]  Troponin I - ONCE - STAT     Status: None   Collection Time: 02/22/19  5:41 PM  Result Value Ref Range   Troponin I <0.03 <0.03 ng/mL    Comment: Performed at [facility], [address] [place], [place] [address]  Lactic acid,  plasma     Status: None   Collection Time: 02/22/19  5:41 PM  Result Value Ref Range   Lactic Acid, Venous 1.3 0.5 - 1.9 mmol/L    Comment: Performed at [facility], [address] [place], [place] [address]  CK     Status: Abnormal   Collection Time: 02/22/19  5:41 PM  Result Value Ref Range   Total CK 875 (H) 49 - 397 U/L    Comment: Performed at [facility], [address] [place], [place] [address]  Ethanol     Status: None   Collection Time: 02/22/19  7:00 PM  Result Value Ref Range   Alcohol, Ethyl (B) <10 <10 mg/dL    Comment: (NOTE) Lowest detectable limit for serum alcohol is 10 mg/dL. For medical purposes only. Performed at [facility], [address] [place], [place] [address]   CSF culture     Status: None (Preliminary result)   Collection Time: 02/22/19  8:20 PM  Result Value Ref Range   Specimen Description CSF    Special Requests Normal    Gram Stain      NO ORGANISMS SEEN WBC SEEN RED BLOOD CELLS Performed at [facility], [address], [place], [place] [address]    Culture PENDING    Report Status PENDING   Protein and glucose, CSF     Status: Abnormal   Collection Time: 02/22/19  8:20 PM  Result Value Ref Range   Glucose, CSF 71 (H) 40 - 70 mg/dL   Total  Protein, CSF 35 15 - 45 mg/dL    Comment: Performed at [facility], [address] [place], [place] [address]   Ct Head Wo Contrast  Result Date: 02/22/2019 CLINICAL DATA:  Encephalopathy, tremors, gaze and head deviation to the LEFT EXAM: CT HEAD WITHOUT CONTRAST TECHNIQUE: Contiguous axial images were obtained from the base of the skull through the vertex without intravenous contrast. Sagittal and coronal MPR images reconstructed from axial data set. COMPARISON:  04/15/2016 FINDINGS: Brain: Normal ventricular morphology. No midline shift or mass effect. Normal appearance of brain parenchyma. No intracranial hemorrhage, mass lesion, evidence of  acute infarction, or extra-axial fluid collection. Vascular: No hyperdense vessels Skull: Intact Sinuses/Orbits: Clear Other: N/A IMPRESSION: Normal exam.  Electronically Signed   By: Ulyses Southward M.D.   On: 02/22/2019 18:20    Pending Labs Unresulted Labs (From admission, onward)    Start     Ordered   02/22/19 2018  Blood culture (routine x 2)  BLOOD CULTURE X 2,   STAT     02/22/19 2017   02/22/19 2017  CSF cell count with differential collection tube #: 1  (CSF Labs)  Once,   STAT    Question:  collection tube #  Answer:  1   02/22/19 2016   02/22/19 2017  CSF cell count with differential collection tube #: tube 4  (CSF Labs)  Once,   STAT    Question:  collection tube #  Answer:  tube 4   02/22/19 2016   02/22/19 2017  Herpes simplex virus (HSV), DNA by PCR Cerebrospinal Fluid  ONCE - STAT,   STAT     02/22/19 2016   02/22/19 1741  Urine Drug Screen, Qualitative ([facility] only)  Once,   STAT     02/22/19 1741   Signed and Held  HIV antibody (Routine Testing)  Once,   R     Signed and Held   Signed and Held  CBC  (enoxaparin (LOVENOX)    CrCl >/= 30 ml/min)  Once,   R    Comments:  Baseline for enoxaparin therapy IF NOT ALREADY DRAWN.  Notify MD if PLT < 100 K.    Signed and Held   Signed and Held  Creatinine, serum  (enoxaparin (LOVENOX)    CrCl >/= 30 ml/min)  Once,   R    Comments:  Baseline for enoxaparin therapy IF NOT ALREADY DRAWN.    Signed and Held   Signed and Held  Creatinine, serum  (enoxaparin (LOVENOX)    CrCl >/= 30 ml/min)  Weekly,   R    Comments:  while on enoxaparin therapy    Signed and Held   Signed and Held  CBC  Tomorrow morning,   R     Signed and Held   Signed and Held  Comprehensive metabolic panel  Tomorrow morning,   R     Signed and Held          Vitals/Pain Today's Vitals   02/22/19 1814 02/22/19 1830 02/22/19 1930 02/22/19 1956  BP:  137/84 140/86   Pulse:  92 78   Resp:  (!) 21    Temp:      TempSrc:      SpO2:  98% 99%   PainSc:  10-Worst pain ever   6     Isolation Precautions No active isolations  Medications Medications  vancomycin (VANCOCIN) IVPB 1000 mg/200 mL premix (has no administration in time range)  diphenhydrAMINE (BENADRYL) injection 50 mg (50 mg Intravenous Given 02/22/19 1747)  LORazepam (ATIVAN) injection 2 mg (2 mg Intravenous Given 02/22/19 1747)  sodium chloride 0.9 % bolus 1,000 mL (1,000 mLs Intravenous Bolus 02/22/19 1924)  benztropine mesylate (COGENTIN) injection 2 mg (2 mg Intravenous Given 02/22/19 1801)  lidocaine-prilocaine (EMLA) 2.5-2.5 % cream (  Given by Other 02/22/19 2018)  lidocaine (PF) (XYLOCAINE) 1 % injection (  Given by Other 02/22/19 2018)  cefTRIAXone (ROCEPHIN) 2 g in sodium chloride 0.9 % 100 mL IVPB (0 g Intravenous Stopped 02/22/19 2134)  vancomycin (VANCOCIN) IVPB 1000 mg/200 mL premix (0 mg Intravenous Stopped 02/22/19 2241)  dexamethasone (DECADRON) injection 10 mg (10 mg Intravenous Given 02/22/19 2051)  sodium chloride 0.9 % bolus  1,000 mL (1,000 mLs Intravenous Bolus 02/22/19 2056)  LORazepam (ATIVAN) injection 1 mg (1 mg Intravenous Given 02/22/19 2019)    Mobility walks     Focused Assessments Neuro Assessment Handoff:  Swallow screen pass? NA         Neuro Assessment:   Neuro Checks:      Last Documented NIHSS Modified Score:   Has TPA been given? No If patient is a Neuro Trauma and patient is going to OR before floor call report to 4N Charge nurse: [phone] or [phone]     R Recommendations: See Admitting Provider Note  Report given to:   Additional Notes:  Pt is hallucinating. Pt is calm and cooperative.

## 2019-02-22 NOTE — Consult Note (Signed)
Pharmacy Antibiotic Note  Seth Luna is a 23 y.o. male admitted on 02/22/2019 with possible Meningitis.  Pharmacy has been consulted for Vancomycin dosing. Patient received vancomycin 1g IV x 1 dose in ED.  Plan: Will start vancomycin 1g every 12 hours with 6 hour stack dosing. Predicted trough at Css is 17.5.  Vanc trough ordered prior to 4th dose.  Kinetics: Ke: 0.092   T1/2: 7.5    VD: 36.54  Will also recommend ordering ceftriaxone 2g IV every 12 hours.   Per policy AUC dosing not used for dosing vancomycin for meningitis   Temp (24hrs), Avg:98.3 F (36.8 C), Min:98 F (36.7 C), Max:98.6 F (37 C)  Recent Labs  Lab 02/22/19 0043 02/22/19 1741  WBC 7.0 11.4*  CREATININE 0.73 0.75  LATICACIDVEN  --  1.3    Estimated Creatinine Clearance: 106 mL/min (by C-G formula based on SCr of 0.75 mg/dL).    Allergies  Allergen Reactions  . Haldol [Haloperidol Lactate] Other (See Comments)    Dystonic reaction    Antimicrobials this admission: 5/2 ceftriaxone >>  5/2 vancomycin >>   Microbiology results: 5/2 BCx: pending 5/2 CSF Cx: pending   Thank you for allowing pharmacy to be a part of this patient's care.  Gardner Candle, PharmD, BCPS Clinical Pharmacist 02/22/2019 10:40 PM

## 2019-02-22 NOTE — ED Notes (Signed)
Pts mom Peggy: [phone] to be updated

## 2019-02-22 NOTE — ED Notes (Signed)
Mother([phone] called wanted to be updated on patient and stated she would be available all night if needed.

## 2019-02-22 NOTE — ED Notes (Signed)
Pt. Appears paranoid and anxious.  Pt. Admits to drinking alcohol today"I had a couple of beers".  Pt. Also admitted meth use, but could not remember last use. "I use a lot".  When asked, "how long?", pt. Stated long time.  Pt. Had to be reassured several times, that he and his family are not in trouble.  When patient was asked "Do you want to hurt yourself or anyone else, pt. Stated "No".  Pt. Tearful during assessment.

## 2019-02-22 NOTE — ED Notes (Signed)
Pt given meal tray and milk at this time 

## 2019-02-23 LAB — CBC
HCT: 42.7 % (ref 39.0–52.0)
Hemoglobin: 14.5 g/dL (ref 13.0–17.0)
MCH: 30 pg (ref 26.0–34.0)
MCHC: 34 g/dL (ref 30.0–36.0)
MCV: 88.2 fL (ref 80.0–100.0)
Platelets: 314 10*3/uL (ref 150–400)
RBC: 4.84 MIL/uL (ref 4.22–5.81)
RDW: 12.3 % (ref 11.5–15.5)
WBC: 6.9 10*3/uL (ref 4.0–10.5)
nRBC: 0 % (ref 0.0–0.2)

## 2019-02-23 LAB — URINE DRUG SCREEN, QUALITATIVE (ARMC ONLY)
Amphetamines, Ur Screen: POSITIVE — AB
Barbiturates, Ur Screen: NOT DETECTED
Benzodiazepine, Ur Scrn: POSITIVE — AB
Cannabinoid 50 Ng, Ur ~~LOC~~: NOT DETECTED
Cocaine Metabolite,Ur ~~LOC~~: NOT DETECTED
MDMA (Ecstasy)Ur Screen: NOT DETECTED
Methadone Scn, Ur: NOT DETECTED
Opiate, Ur Screen: NOT DETECTED
Phencyclidine (PCP) Ur S: NOT DETECTED
Tricyclic, Ur Screen: NOT DETECTED

## 2019-02-23 LAB — COMPREHENSIVE METABOLIC PANEL
ALT: 20 U/L (ref 0–44)
AST: 30 U/L (ref 15–41)
Albumin: 3.9 g/dL (ref 3.5–5.0)
Alkaline Phosphatase: 43 U/L (ref 38–126)
Anion gap: 9 (ref 5–15)
BUN: 11 mg/dL (ref 6–20)
CO2: 26 mmol/L (ref 22–32)
Calcium: 8.8 mg/dL — ABNORMAL LOW (ref 8.9–10.3)
Chloride: 103 mmol/L (ref 98–111)
Creatinine, Ser: 0.61 mg/dL (ref 0.61–1.24)
GFR calc Af Amer: >60 mL/min (ref >60)
GFR calc non Af Amer: >60 mL/min (ref >60)
Glucose, Bld: 180 mg/dL — ABNORMAL HIGH (ref 70–99)
Potassium: 4.5 mmol/L (ref 3.5–5.1)
Sodium: 138 mmol/L (ref 135–145)
Total Bilirubin: 0.7 mg/dL (ref 0.3–1.2)
Total Protein: 6.8 g/dL (ref 6.5–8.1)

## 2019-02-23 MED ORDER — ONDANSETRON HCL 4 MG PO TABS: 4.0000 mg | ORAL_TABLET | Freq: Four times a day (QID) | ORAL | Status: DC | PRN

## 2019-02-23 MED ORDER — ENOXAPARIN SODIUM 40 MG/0.4ML ~~LOC~~ SOLN: 40.0000 mg | SUBCUTANEOUS | Status: DC

## 2019-02-23 MED ORDER — ONDANSETRON HCL 4 MG/2ML IJ SOLN: 4.0000 mg | Freq: Four times a day (QID) | INTRAMUSCULAR | Status: DC | PRN

## 2019-02-23 MED ORDER — ACETAMINOPHEN 325 MG PO TABS: 650.0000 mg | ORAL_TABLET | Freq: Four times a day (QID) | ORAL | Status: DC | PRN

## 2019-02-23 MED ORDER — ACETAMINOPHEN 650 MG RE SUPP: 650.0000 mg | Freq: Four times a day (QID) | RECTAL | Status: DC | PRN

## 2019-02-23 MED ORDER — SODIUM CHLORIDE 0.9 % IV SOLN
INTRAVENOUS | Status: DC
  Administered 2019-02-23: 01:00:00 via INTRAVENOUS

## 2019-02-23 NOTE — Discharge Summary (Signed)
[facility] [place] at [facility]   PATIENT NAME: Seth Luna    MR#:  [id number]  DATE OF BIRTH:  Dec 12, 1995  DATE OF ADMISSION:  02/22/2019 ADMITTING PHYSICIAN: Oralia Manis, MD  DATE OF DISCHARGE: 02/23/2019  PRIMARY CARE PHYSICIAN: Patient, No Pcp Per    ADMISSION DIAGNOSIS:  Hallucinations [R44.3] Acute dystonic reaction due to drugs [G24.02]  DISCHARGE DIAGNOSIS:  Principal Problem:   Acute encephalopathy Active Problems:   Dystonic drug reaction   Substance abuse (HCC)   SECONDARY DIAGNOSIS:   Past Medical History:  Diagnosis Date  . ADHD (attention deficit hyperactivity disorder)   . Substance abuse [facility])     HOSPITAL COURSE:   23 year old male with past medical history of ADHD, substance abuse who presented to the hospital due to encephalopathy and dystonia.  1.  Encephalopathy/dystonic reaction- patient initially presented to the ER and thought to have a adverse reaction to the use of methamphetamines and was discharged home but presented back to hospital due to dystonic movements of his head and neck area.  He was admitted for suspected seizures/possible meningitis. - Patient underwent a lumbar puncture which showed no evidence of meningitis/encephalitis. -He has had no further dystonic reactions or movements.  His encephalopathy has significantly improved.  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 -Since patient is at baseline now he is being discharged home.  DISCHARGE CONDITIONS:   Stable.   CONSULTS OBTAINED:    DRUG ALLERGIES:   Allergies  Allergen Reactions  . Haldol [Haloperidol Lactate] Other (See Comments)    Dystonic reaction    DISCHARGE MEDICATIONS:   Allergies as of 02/23/2019      Reactions   Haldol [haloperidol Lactate] Other (See Comments)   Dystonic reaction      Medication List    You have not been prescribed any medications.       DISCHARGE INSTRUCTIONS:   DIET:   Regular diet  DISCHARGE CONDITION:  Stable  ACTIVITY:  Activity as tolerated  OXYGEN:  Home Oxygen: No.   Oxygen Delivery: room air  DISCHARGE LOCATION:  home   If you experience worsening of your admission symptoms, develop shortness of breath, life threatening emergency, suicidal or homicidal thoughts you must seek medical attention immediately by calling 911 or calling your MD immediately  if symptoms less severe.  You Must read complete instructions/literature along with all the possible adverse reactions/side effects for all the Medicines you take and that have been prescribed to you. Take any new Medicines after you have completely understood and accpet all the possible adverse reactions/side effects.   Please note  You were cared for by a hospitalist during your hospital stay. If you have any questions about your discharge medications or the care you received while you were in the hospital after you are discharged, you can call the unit and asked to speak with the hospitalist on call if the hospitalist that took care of you is not available. Once you are discharged, your primary care physician will handle any further medical issues. Please note that NO REFILLS for any discharge medications will be authorized once you are discharged, as it is imperative that you return to your primary care physician (or establish a relationship with a primary care physician if you do not have one) for your aftercare needs so that they can reassess your need for medications and monitor your lab values.     Today   Status back to baseline.  Afebrile, no  other complaints presently.  No further dystonic reactions or myoclonic movements.  VITAL SIGNS:  Blood pressure 114/73, pulse 97, temperature 98.6 F (37 C), temperature source Oral, resp. rate 16, height 5\' 2"  (1.575 m), weight 53 kg, SpO2 100 %.  I/O:    Intake/Output Summary (Last 24 hours) at 02/23/2019 1200 Last data filed at 02/22/2019  2241 Gross per 24 hour  Intake 300.02 ml  Output -  Net 300.02 ml    PHYSICAL EXAMINATION:  GENERAL:  23 y.o.-year-old patient lying in the bed with no acute distress.  EYES: Pupils equal, round, reactive to light and accommodation. No scleral icterus. Extraocular muscles intact.  HEENT: Head atraumatic, normocephalic. Oropharynx and nasopharynx clear.  NECK:  Supple, no jugular venous distention. No thyroid enlargement, no tenderness.  LUNGS: Normal breath sounds bilaterally, no wheezing, rales,rhonchi. No use of accessory muscles of respiration.  CARDIOVASCULAR: S1, S2 normal. No murmurs, rubs, or gallops.  ABDOMEN: Soft, non-tender, non-distended. Bowel sounds present. No organomegaly or mass.  EXTREMITIES: No pedal edema, cyanosis, or clubbing.  NEUROLOGIC: Cranial nerves II through XII are intact. No focal motor or sensory defecits b/l.  PSYCHIATRIC: The patient is alert and oriented x 3.  SKIN: No obvious rash, lesion, or ulcer.   DATA REVIEW:   CBC Recent Labs  Lab 02/23/19 0514  WBC 6.9  HGB 14.5  HCT 42.7  PLT 314    Chemistries  Recent Labs  Lab 02/23/19 0514  NA 138  K 4.5  CL 103  CO2 26  GLUCOSE 180*  BUN 11  CREATININE 0.61  CALCIUM 8.8*  AST 30  ALT 20  ALKPHOS 43  BILITOT 0.7    Cardiac Enzymes Recent Labs  Lab 02/22/19 1741  TROPONINI <0.03    Microbiology Results  Results for orders placed or performed during the hospital encounter of 02/22/19  CSF culture     Status: None (Preliminary result)   Collection Time: 02/22/19  8:20 PM  Result Value Ref Range Status   Specimen Description   Final    CSF Performed at [facility], [address] [place], [place] [address]    Special Requests   Final    Normal Performed at [facility], [address] [place], [place] [address]    Gram Stain   Final    NO ORGANISMS SEEN WBC SEEN RED BLOOD CELLS Performed at [facility], [address],  [place], [place] [address]    Culture   Final    NO GROWTH < 12 HOURS Performed at [facility], [address], [place], [place] [address]    Report Status PENDING  Incomplete  Blood culture (routine x 2)     Status: None (Preliminary result)   Collection Time: 02/22/19  8:20 PM  Result Value Ref Range Status   Specimen Description BLOOD RIGHT ANTECUBITAL  Final   Special Requests   Final    BOTTLES DRAWN AEROBIC AND ANAEROBIC Blood Culture adequate volume   Culture   Final    NO GROWTH < 12 HOURS Performed at [facility], [address] [place], [place] [address]    Report Status PENDING  Incomplete  Blood culture (routine x 2)     Status: None (Preliminary result)   Collection Time: 02/22/19  8:20 PM  Result Value Ref Range Status   Specimen Description BLOOD LEFT ANTECUBITAL  Final   Special Requests   Final    BOTTLES DRAWN AEROBIC AND ANAEROBIC  Blood Culture adequate volume   Culture   Final    NO GROWTH < 12 HOURS Performed at [facility], [address] [place], [place] [address]    Report Status PENDING  Incomplete    RADIOLOGY:  Ct Head Wo Contrast  Result Date: 02/22/2019 CLINICAL DATA:  Encephalopathy, tremors, gaze and head deviation to the LEFT EXAM: CT HEAD WITHOUT CONTRAST TECHNIQUE: Contiguous axial images were obtained from the base of the skull through the vertex without intravenous contrast. Sagittal and coronal MPR images reconstructed from axial data set. COMPARISON:  04/15/2016 FINDINGS: Brain: Normal ventricular morphology. No midline shift or mass effect. Normal appearance of brain parenchyma. No intracranial hemorrhage, mass lesion, evidence of acute infarction, or extra-axial fluid collection. Vascular: No hyperdense vessels Skull: Intact Sinuses/Orbits: Clear Other: N/A IMPRESSION: Normal exam. Electronically Signed   By: Ulyses SouthwardMark  Boles M.D.   On: 02/22/2019 18:20      Management plans discussed with the patient, family and they  are in agreement.  CODE STATUS:     Code Status Orders  (From admission, onward)         Start     Ordered   02/23/19 0052  Full code  Continuous     02/23/19 0051        TOTAL TIME TAKING CARE OF THIS PATIENT: 40 minutes.    Houston SirenVivek J Faisal Stradling M.D on 02/23/2019 at 12:00 PM  Between 7am to 6pm - Pager - [phone]  After 6pm go to [email or web address] - Social research officer, [email or web address] [email or web address]  [facility] [place] [facility]  Office  [phone]  CC: Primary care physician; Patient, No Pcp Per

## 2019-02-23 NOTE — Discharge Instructions (Signed)
Spasticity  Spasticity is a condition in which your muscles contract suddenly and unpredictably (spasm). Spasticity usually affects your arms, legs, or back. It can also affect the way you walk.  Spasticity can range from mild muscle stiffness and tightness to severe, uncontrollable muscle spasms. Severe spasticity can be painful and can freeze your muscles in an uncomfortable position.  Follow these instructions at home:  Managing muscle stiffness and spasms         Wear a brace as told by your health care provider to prevent muscle contractions.  Have the affected muscles massaged.  If directed, apply heat to the affected muscle area. Use the heat source that your health care provider recommends, such as a moist heat pack or heating pad.  Place a towel between your skin and the heat source.  Leave the heat on for 20-30 minutes.  Remove the head if your skin turns bright red. This is especially important if you are unable to feel pain, heat, or cold. You may have a greater risk of getting burned.  If directed, apply ice to the affected muscle area:  Put ice in a plastic bag.  Place a towel between your skin and the bag or between your brace and the bag.  Leave the ice on for 20 minutes, 2?3 times a day.  Activity  Stay active as directed by your health care provider. Find a safe exercise program that fits your needs and ability.  Maintain good posture when walking and sitting.  Work with a physical therapist to learn exercises that will stretch and strengthen your muscles.  Do stretching and range of motion exercises at home as told by a physical therapist.  Work with an occupational therapist. This type of health care provider can help you function better at home and at work.  If you have severe spasticity, use mobility aids, such as a walker or cane, as told by your health care provider.  General instructions  Watch your condition for any changes.  Wear loose, comfortable clothing that does not restrict your  movement.  Wear closed-toe shoes that fit well and support your feet. Wear shoes that have rubber soles or low heels.  Keep all follow-up visits as told by your health care provider. This is important.  Take over-the-counter and prescription medicine only as told by your health care provider.  Contact a health care provider if you:  Have worsening muscle spasms.  Develop other symptoms along with spasticity.  Have a fever or chills.  Experience a burning feeling when you pass urine.  Become constipated.  Need more support at home.  Get help right away if you:  Have trouble breathing.  Have a muscle spasm that freezes you into a painful position.  Cannot walk.  Cannot care for yourself at home.  Have trouble passing urine or have urinary incontinence.  Summary  Spasticity is a condition in which your muscles contract suddenly and unpredictably (spasm). Spasticity usually affects your arms, legs, or back.  Spasticity can range from mild muscle stiffness and tightness to severe, uncontrollable muscle spasms.  Do stretching and range of motion exercises at home as told by a physical therapist.  Take over-the-counter and prescription medicine only as told by your health care provider.  This information is not intended to replace advice given to you by your health care provider. Make sure you discuss any questions you have with your health care provider.  Document Released: 09/29/2002 Document Revised: 11/06/2017 Document Reviewed: 11/06/2017  

## 2019-02-24 LAB — PATHOLOGIST SMEAR REVIEW

## 2019-02-25 LAB — HSV DNA BY PCR (REFERENCE LAB)
HSV 1 DNA: NEGATIVE
HSV 2 DNA: NEGATIVE

## 2019-02-25 LAB — HIV ANTIBODY (ROUTINE TESTING W REFLEX): HIV Screen 4th Generation wRfx: NONREACTIVE

## 2019-02-26 LAB — CSF CULTURE W GRAM STAIN
Culture: NO GROWTH
Gram Stain: NONE SEEN
Special Requests: NORMAL

## 2019-02-27 LAB — CULTURE, BLOOD (ROUTINE X 2)
Culture: NO GROWTH
Culture: NO GROWTH
Special Requests: ADEQUATE
Special Requests: ADEQUATE

## 2019-11-29 ENCOUNTER — Emergency Department: Payer: Self-pay

## 2019-11-29 ENCOUNTER — Encounter: Payer: Self-pay | Admitting: Emergency Medicine

## 2019-11-29 ENCOUNTER — Other Ambulatory Visit: Payer: Self-pay

## 2019-11-29 ENCOUNTER — Emergency Department
Admission: EM | Admit: 2019-11-29 | Discharge: 2019-11-29 | Disposition: A | Discharge status: Home / Self Care | Payer: Self-pay | Attending: Emergency Medicine | Admitting: Emergency Medicine

## 2019-11-29 DIAGNOSIS — Y929 Unspecified place or not applicable: Secondary | ICD-10-CM | POA: Insufficient documentation

## 2019-11-29 DIAGNOSIS — R519 Headache, unspecified: Secondary | ICD-10-CM | POA: Insufficient documentation

## 2019-11-29 DIAGNOSIS — R4182 Altered mental status, unspecified: Secondary | ICD-10-CM | POA: Insufficient documentation

## 2019-11-29 DIAGNOSIS — F17210 Nicotine dependence, cigarettes, uncomplicated: Secondary | ICD-10-CM | POA: Insufficient documentation

## 2019-11-29 DIAGNOSIS — Y999 Unspecified external cause status: Secondary | ICD-10-CM | POA: Insufficient documentation

## 2019-11-29 DIAGNOSIS — Y93C2 Activity, hand held interactive electronic device: Secondary | ICD-10-CM | POA: Insufficient documentation

## 2019-11-29 DIAGNOSIS — W228XXA Striking against or struck by other objects, initial encounter: Secondary | ICD-10-CM | POA: Insufficient documentation

## 2019-11-29 DIAGNOSIS — S0101XA Laceration without foreign body of scalp, initial encounter: Secondary | ICD-10-CM | POA: Insufficient documentation
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
## 2019-12-07 ENCOUNTER — Emergency Department
Admission: EM | Admit: 2019-12-07 | Discharge: 2019-12-07 | Disposition: A | Discharge status: Home / Self Care | Payer: Self-pay | Attending: Emergency Medicine | Admitting: Emergency Medicine

## 2019-12-07 ENCOUNTER — Other Ambulatory Visit: Payer: Self-pay

## 2019-12-07 ENCOUNTER — Encounter: Payer: Self-pay | Admitting: Emergency Medicine

## 2019-12-07 DIAGNOSIS — F17200 Nicotine dependence, unspecified, uncomplicated: Secondary | ICD-10-CM | POA: Insufficient documentation

## 2019-12-07 DIAGNOSIS — Z4802 Encounter for removal of sutures: Secondary | ICD-10-CM | POA: Insufficient documentation

## 2019-12-07 DIAGNOSIS — X58XXXA Exposure to other specified factors, initial encounter: Secondary | ICD-10-CM | POA: Insufficient documentation

## 2019-12-07 DIAGNOSIS — S0101XD Laceration without foreign body of scalp, subsequent encounter: Secondary | ICD-10-CM | POA: Insufficient documentation

## 2019-12-07 NOTE — ED Triage Notes (Signed)
Pt ambulatory to triage for staple removal.

## 2019-12-07 NOTE — ED Notes (Signed)
See triage note  Presents for staple removal  States he had staples placed about 8 days ago  Denies any other complaints
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
## 2019-12-07 NOTE — ED Provider Notes (Signed)
[facility] Emergency Department Provider Note ____________________________________________  Time seen: 47  I have reviewed the triage vital signs and the nursing notes.  HISTORY  Chief Complaint  Suture / Staple Removal  HPI Seth Luna is a 24 y.o. male presents himself to the ED for staple removal.  Patient was seen in the ED last week for repair of a scalp laceration with staples. He denies any interim complaints.   Past Medical History:  Diagnosis Date  . ADHD (attention deficit hyperactivity disorder)   . Substance abuse [facility])     Patient Active Problem List   Diagnosis Date Noted  . 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 Acute encephalopathy 02/22/2019  . Substance abuse ([place]) 02/22/2019    Past Surgical History:  Procedure Laterality Date  . FOOT SURGERY      Prior to Admission medications   Not on File    Allergies Haldol [haloperidol lactate]  History reviewed. No pertinent family history.  Social History Social History   Tobacco Use  . Smoking status: Current Some Day Smoker    Packs/day: 0.50  . Smokeless tobacco: Never Used  Substance Use Topics  . Alcohol use: Yes    Alcohol/week: 28.0 standard drinks    Types: 28 Cans of beer per week  . Drug use: Yes    Types: Marijuana, Methamphetamines    Review of Systems  Constitutional: Negative for fever. Cardiovascular: Negative for chest pain. Respiratory: Negative for shortness of breath. Musculoskeletal: Negative for back pain. Skin: Negative for rash. Scalp laceration as above Neurological: Negative for headaches, focal weakness or numbness. ____________________________________________  PHYSICAL EXAM:  VITAL SIGNS: ED Triage Vitals  Enc Vitals Group     BP 12/07/19 1411 (!) 147/88     Pulse Rate 12/07/19 1411 69     Resp 12/07/19 1411 18     Temp 12/07/19 1411 97.8 F (36.6 C)     Temp src --      SpO2 12/07/19 1411 97 %     Weight 12/07/19 1412 125  lb (56.7 kg)     Height 12/07/19 1412 5\' 3"  (1.6 m)     Head Circumference --      Peak Flow --      Pain Score 12/07/19 1412 0     Pain Loc --      Pain Edu? --      Excl. in [place]? --     Constitutional: Alert and oriented. Well appearing and in no distress. Head: Normocephalic and atraumatic, except for a linear laceration over the top of the scalp. Eight staples are in place. Eyes: Conjunctivae are normal. Normal extraocular movements Cardiovascular: Normal rate, regular rhythm. Normal distal pulses. Respiratory: Normal respiratory effort.  Musculoskeletal: Nontender with normal range of motion in all extremities.  Neurologic:  Normal gait without ataxia. Normal speech and language. No gross focal neurologic deficits are appreciated. Skin:  Skin is warm, dry and intact. No rash noted. ____________________________________________  PROCEDURES  .Suture Removal  Date/Time: 12/07/2019 3:22 PM Performed by: Einar Pheasant, RN Authorized by: Melvenia Needles, PA-C   Consent:    Consent obtained:  Verbal   Consent given by:  Patient   Risks discussed:  Pain Location:    Location:  Head/neck   Head/neck location:  Scalp Procedure details:    Wound appearance:  No signs of infection and good wound healing   Number of staples removed:  8 Post-procedure details:    Patient tolerance  of procedure:  Tolerated well, no immediate complications  ___________________________________________  INITIAL IMPRESSION / ASSESSMENT AND PLAN / ED COURSE  Patient with ED evaluation and request for staple removal.  Patient is 1 week status post scalp laceration repair.  He denies any interim complaints.  Staples are removed from the scalp without difficulty.  Patient is discharged with wound care instructions.  Seth Luna was evaluated in Emergency Department on 12/07/2019 for the symptoms described in the history of present illness. He was evaluated in the context of the global  COVID-19 pandemic, which necessitated consideration that the patient might be at risk for infection with the SARS-CoV-2 virus that causes COVID-19. Institutional protocols and algorithms that pertain to the evaluation of patients at risk for COVID-19 are in a state of rapid change based on information released by regulatory bodies including the CDC and federal and state organizations. These policies and algorithms were followed during the patient's care in the ED. ____________________________________________  FINAL CLINICAL IMPRESSION(S) / ED DIAGNOSES  Final diagnoses:  Encounter for staple removal      Karmen Stabs, Charlesetta Ivory, PA-C 12/07/19 1553    Sharyn Creamer, MD 12/07/19 1705

## 2020-05-04 ENCOUNTER — Telehealth: Payer: Self-pay | Admitting: General Practice

## 2020-05-04 NOTE — Telephone Encounter (Signed)
Individual has been contacted 3+ times regarding ED referral. No further attempts to contact individual will be made. 

## 2020-05-28 ENCOUNTER — Emergency Department
Admission: EM | Admit: 2020-05-28 | Discharge: 2020-05-29 | Disposition: A | Discharge status: Home / Self Care | Payer: Self-pay | Attending: Emergency Medicine | Admitting: Emergency Medicine

## 2020-05-28 ENCOUNTER — Encounter: Payer: Self-pay | Admitting: Emergency Medicine

## 2020-05-28 ENCOUNTER — Emergency Department: Payer: Self-pay

## 2020-05-28 ENCOUNTER — Other Ambulatory Visit: Payer: Self-pay

## 2020-05-28 DIAGNOSIS — R079 Chest pain, unspecified: Secondary | ICD-10-CM | POA: Insufficient documentation

## 2020-05-28 DIAGNOSIS — K920 Hematemesis: Secondary | ICD-10-CM | POA: Insufficient documentation

## 2020-05-28 DIAGNOSIS — Z5321 Procedure and treatment not carried out due to patient leaving prior to being seen by health care provider: Secondary | ICD-10-CM | POA: Insufficient documentation

## 2020-05-28 DIAGNOSIS — R042 Hemoptysis: Secondary | ICD-10-CM | POA: Insufficient documentation

## 2020-05-28 DIAGNOSIS — R070 Pain in throat: Secondary | ICD-10-CM | POA: Insufficient documentation

## 2020-05-28 LAB — CBC
HCT: 44.2 % (ref 39.0–52.0)
Hemoglobin: 16 g/dL (ref 13.0–17.0)
MCH: 31.4 pg (ref 26.0–34.0)
MCHC: 36.2 g/dL — ABNORMAL HIGH (ref 30.0–36.0)
MCV: 86.8 fL (ref 80.0–100.0)
Platelets: 285 10*3/uL (ref 150–400)
RBC: 5.09 MIL/uL (ref 4.22–5.81)
RDW: 12.1 % (ref 11.5–15.5)
WBC: 9.8 10*3/uL (ref 4.0–10.5)
nRBC: 0 % (ref 0.0–0.2)

## 2020-05-28 LAB — BASIC METABOLIC PANEL
Anion gap: 12 (ref 5–15)
BUN: <5 mg/dL — ABNORMAL LOW (ref 6–20)
CO2: 26 mmol/L (ref 22–32)
Calcium: 8.6 mg/dL — ABNORMAL LOW (ref 8.9–10.3)
Chloride: 105 mmol/L (ref 98–111)
Creatinine, Ser: 0.74 mg/dL (ref 0.61–1.24)
GFR calc Af Amer: >60 mL/min (ref >60)
GFR calc non Af Amer: >60 mL/min (ref >60)
Glucose, Bld: 136 mg/dL — ABNORMAL HIGH (ref 70–99)
Potassium: 3.6 mmol/L (ref 3.5–5.1)
Sodium: 143 mmol/L (ref 135–145)

## 2020-05-28 LAB — TROPONIN I (HIGH SENSITIVITY)
Troponin I (High Sensitivity): 19 ng/L — ABNORMAL HIGH (ref <18)
Troponin I (High Sensitivity): 10 ng/L (ref <18)

## 2020-05-28 MED ORDER — SODIUM CHLORIDE 0.9% FLUSH: 3.0000 mL | Freq: Once | INTRAVENOUS | Status: DC

## 2020-05-28 NOTE — ED Triage Notes (Signed)
Pt presents to ED via POV with c/o substernal CP and hemoptysis since 7/26. Pt states was hoping symptoms was resolve but they didn't so he came in. Pt states feels like pain in throat. Pt A&O x4, ambulatory without difficulty.

## 2020-06-28 ENCOUNTER — Ambulatory Visit: Payer: Self-pay

## 2021-01-24 ENCOUNTER — Other Ambulatory Visit: Payer: Self-pay

## 2021-01-25 ENCOUNTER — Other Ambulatory Visit: Payer: Self-pay

## 2021-01-25 MED ORDER — FLUOXETINE HCL 10 MG PO CAPS
ORAL_CAPSULE | ORAL | 1 refills | Status: DC
  Filled 2021-01-25: qty 60, 30d supply, fill #0

## 2021-02-23 ENCOUNTER — Other Ambulatory Visit: Payer: Self-pay

## 2021-02-23 MED ORDER — FLUOXETINE HCL 40 MG PO CAPS
ORAL_CAPSULE | ORAL | 1 refills | Status: DC
  Filled 2021-02-23: qty 30, 30d supply, fill #0
  Filled 2021-02-24: qty 7, 7d supply, fill #0
  Filled 2021-03-23: qty 30, 30d supply, fill #1

## 2021-02-24 ENCOUNTER — Other Ambulatory Visit: Payer: Self-pay

## 2021-03-23 ENCOUNTER — Other Ambulatory Visit: Payer: Self-pay

## 2021-04-19 ENCOUNTER — Other Ambulatory Visit: Payer: Self-pay

## 2021-04-19 MED ORDER — AMOXICILLIN 500 MG PO CAPS
500.0000 mg | ORAL_CAPSULE | Freq: Four times a day (QID) | ORAL | 0 refills | Status: DC | PRN
  Filled 2021-04-19 – 2021-04-20 (×2): qty 28, 7d supply, fill #0

## 2021-04-20 ENCOUNTER — Other Ambulatory Visit: Payer: Self-pay

## 2021-04-20 MED ORDER — IBUPROFEN 600 MG PO TABS: ORAL_TABLET | ORAL | 0 refills | Status: DC

## 2021-04-22 ENCOUNTER — Other Ambulatory Visit: Payer: Self-pay

## 2021-04-22 ENCOUNTER — Ambulatory Visit: Payer: Self-pay | Admitting: Pharmacy Technician

## 2021-04-22 DIAGNOSIS — Z79899 Other long term (current) drug therapy: Secondary | ICD-10-CM

## 2021-04-22 MED ORDER — FLUOXETINE HCL 40 MG PO CAPS
40.0000 mg | ORAL_CAPSULE | Freq: Every morning | ORAL | 0 refills | Status: DC
  Filled 2021-04-22: qty 14, 14d supply, fill #0

## 2021-04-28 IMAGING — CR DG CHEST 2V
1 series · 2 of 2 positions shown · non-contrast
Comparison: February 14, 2014.

CLINICAL DATA: Chest pain.

EXAM:
CHEST - 2 VIEW

[Series 1: dg chest 2 view · 0.14mm/px · 2 of 2 slices shown]
[im 1/2]
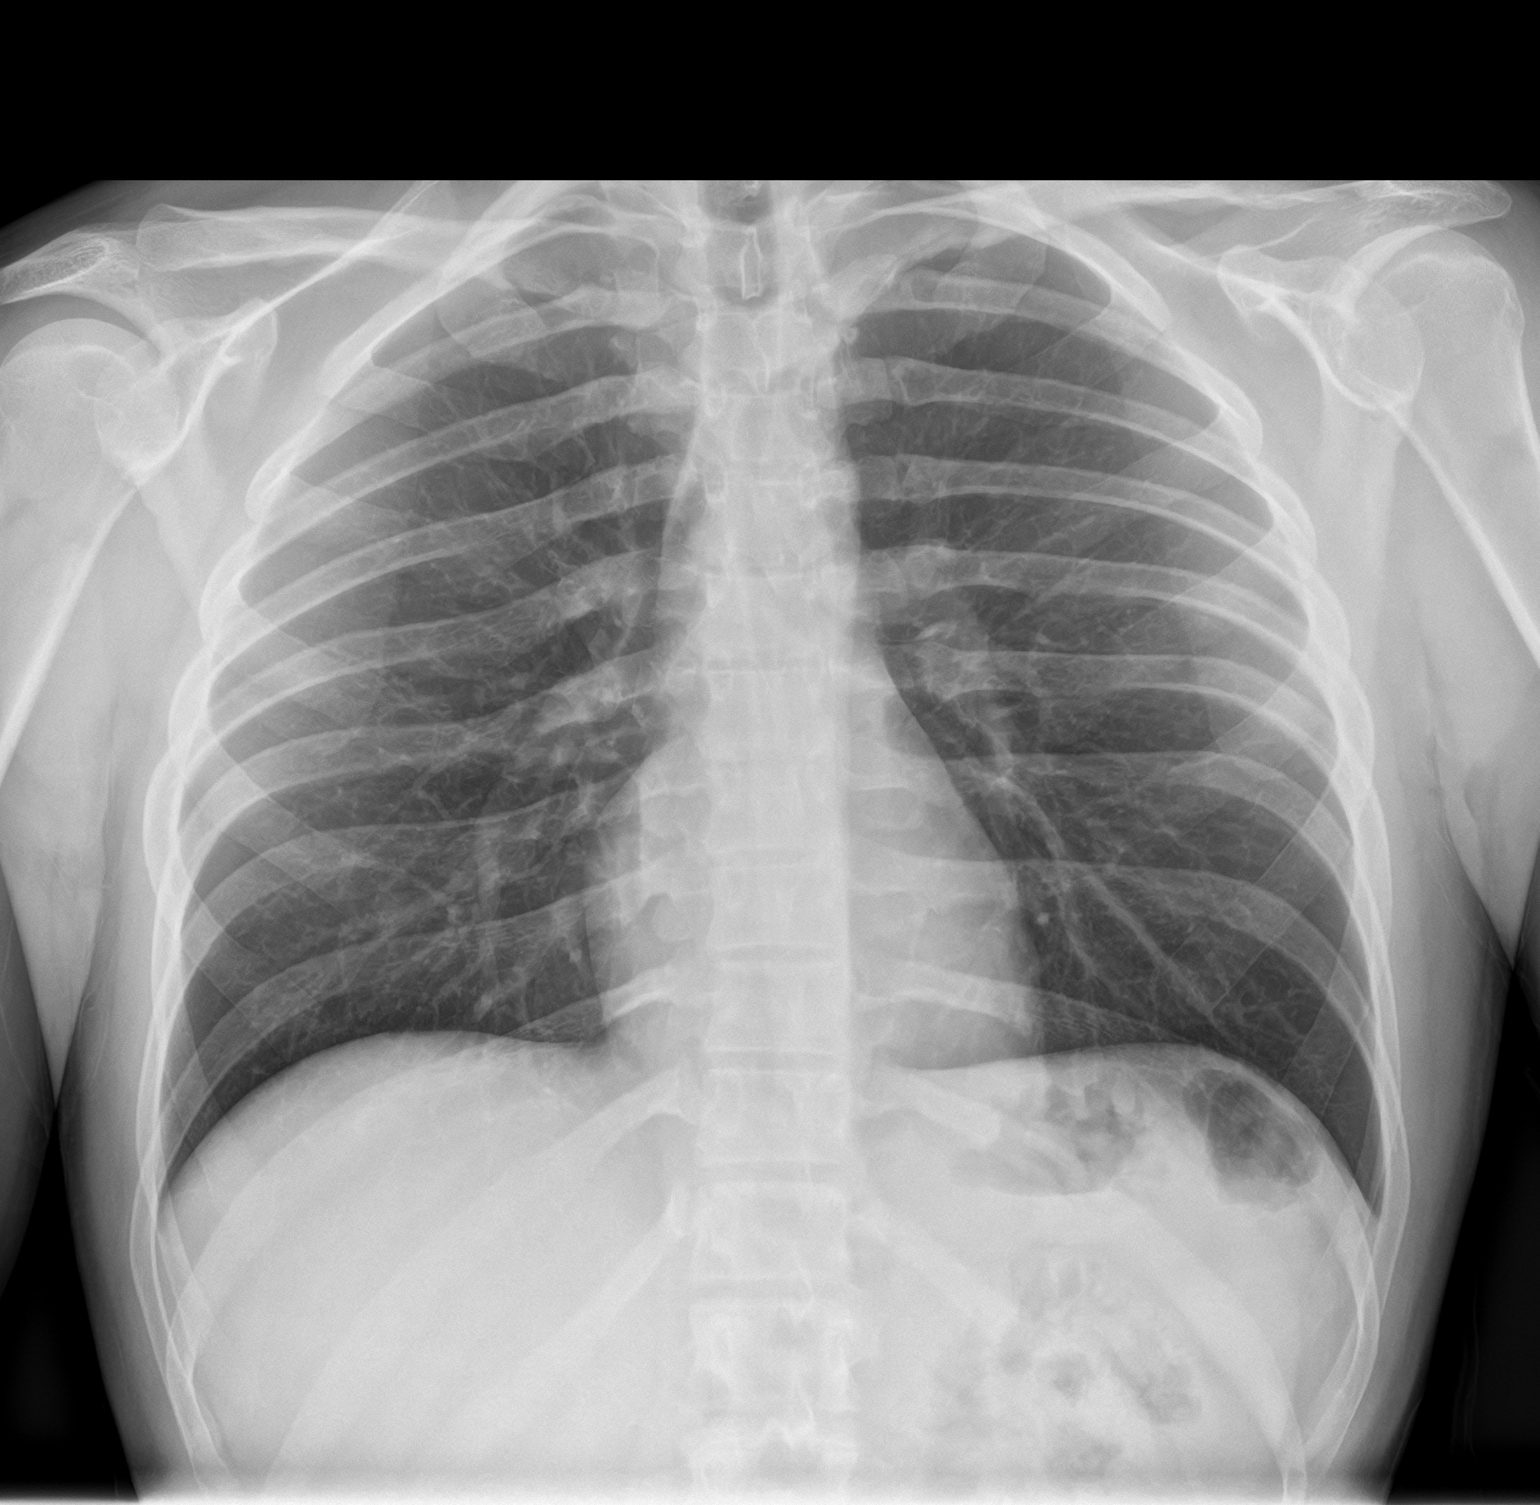
[im 2/2]
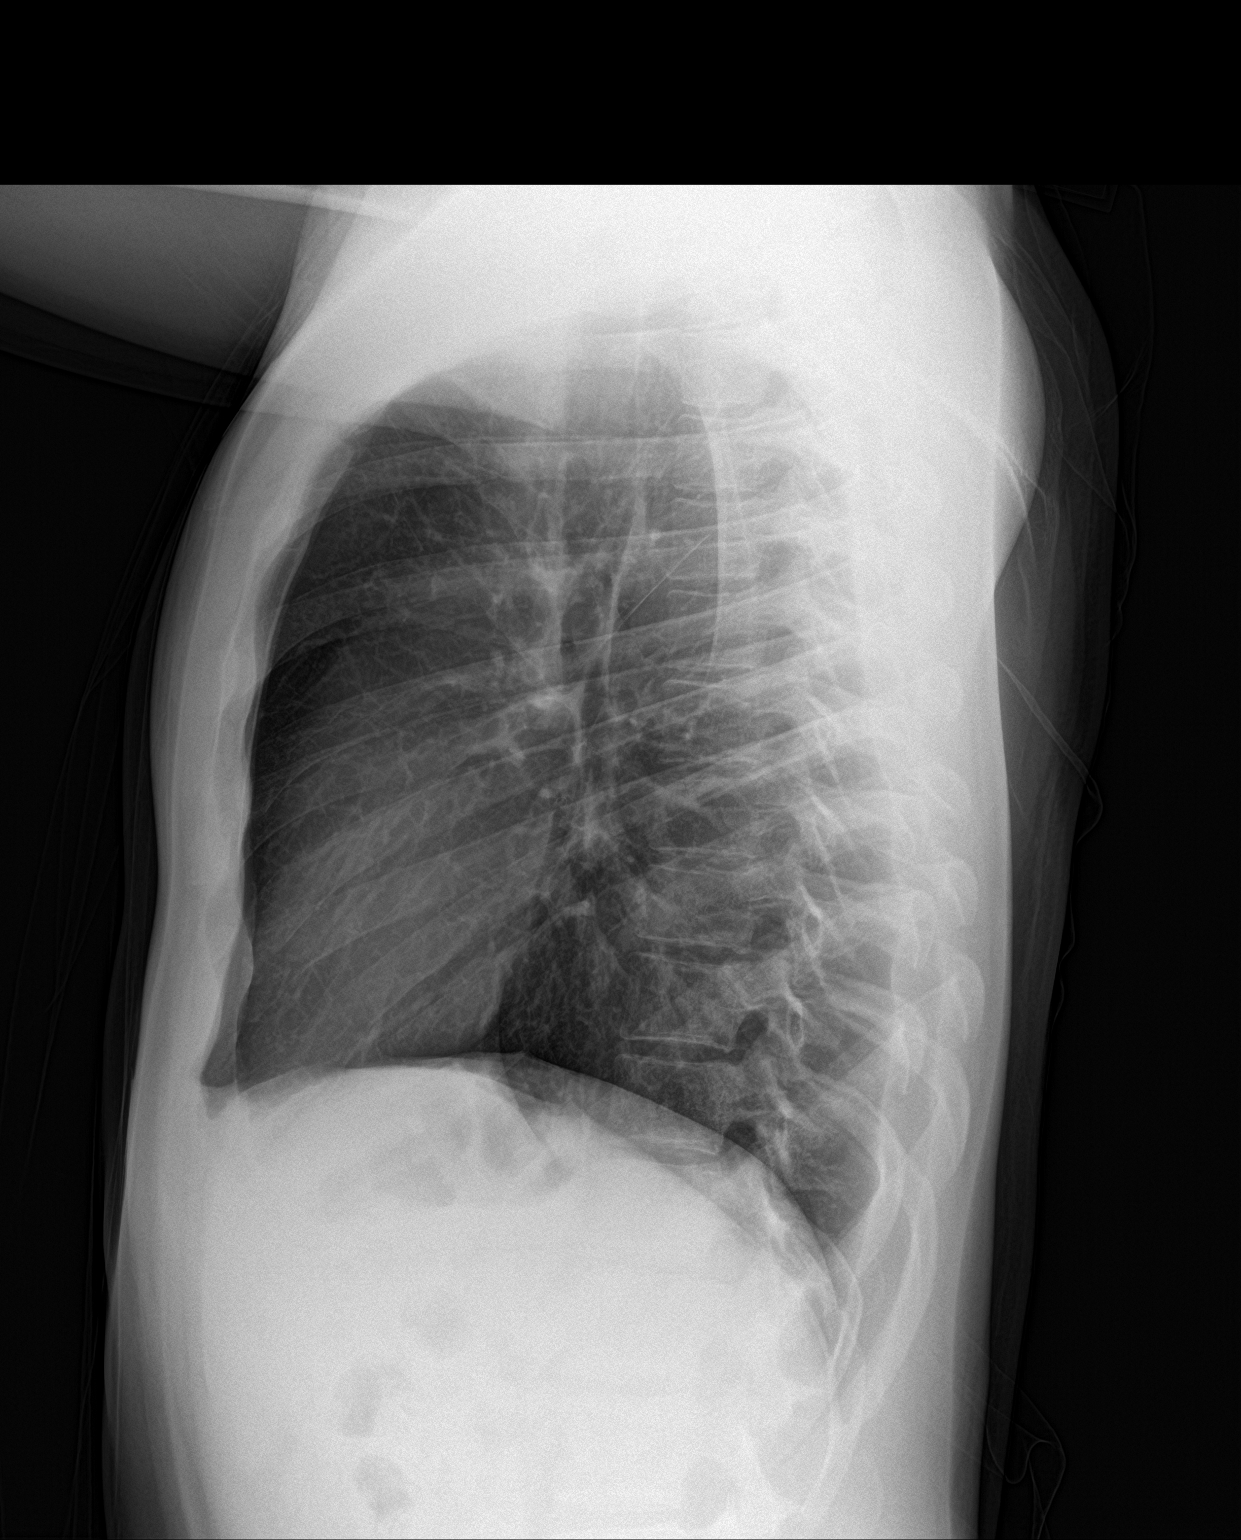

[2 of 2 positions shown; findings below may reference images not displayed]

FINDINGS: The heart size and mediastinal contours are within normal limits.
Both lungs are clear. No pneumothorax or pleural effusion is noted.
The visualized skeletal structures are unremarkable.
IMPRESSION: No active cardiopulmonary disease.

## 2021-05-04 ENCOUNTER — Other Ambulatory Visit: Payer: Self-pay

## 2021-05-04 MED ORDER — FLUOXETINE HCL 40 MG PO CAPS
ORAL_CAPSULE | ORAL | 1 refills | Status: DC
  Filled 2021-05-06: qty 14, 14d supply, fill #0
  Filled 2021-05-25: qty 30, 30d supply, fill #1

## 2021-05-04 MED ORDER — MIRTAZAPINE 15 MG PO TABS
ORAL_TABLET | ORAL | 1 refills | Status: DC
  Filled 2021-05-06: qty 7, 14d supply, fill #0
  Filled 2021-05-25: qty 15, 30d supply, fill #1
  Filled 2021-08-11: qty 8, 16d supply, fill #2

## 2021-05-05 ENCOUNTER — Other Ambulatory Visit: Payer: Self-pay

## 2021-05-06 ENCOUNTER — Other Ambulatory Visit: Payer: Self-pay

## 2021-05-13 ENCOUNTER — Other Ambulatory Visit: Payer: Self-pay

## 2021-05-19 ENCOUNTER — Other Ambulatory Visit: Payer: Self-pay

## 2021-05-19 NOTE — Progress Notes (Signed)
Patient still needs to provide letter of support and 2021 Federal Tax Return.  No additional medication assistance will be provided without this documentation.  Sherilyn Dacosta Care Manager [facility]

## 2021-05-25 ENCOUNTER — Other Ambulatory Visit: Payer: Self-pay

## 2021-05-25 ENCOUNTER — Telehealth: Payer: Self-pay | Admitting: Pharmacy Technician

## 2021-05-25 NOTE — Telephone Encounter (Signed)
Received updated proof of income.  Patient eligible to receive medication assistance at [facility] until time for re-certification in 2023, and as long as eligibility requirements continue to be met.  Brendon Christoffel J. Oddie Bottger Care Manager [facility]  

## 2021-06-06 ENCOUNTER — Other Ambulatory Visit: Payer: Self-pay

## 2021-06-27 ENCOUNTER — Emergency Department
Admission: EM | Admit: 2021-06-27 | Discharge: 2021-06-27 | Disposition: A | Discharge status: Home / Self Care | Payer: Self-pay | Attending: Student in an Organized Health Care Education/Training Program | Admitting: Student in an Organized Health Care Education/Training Program

## 2021-06-27 ENCOUNTER — Other Ambulatory Visit: Payer: Self-pay

## 2021-06-27 DIAGNOSIS — F17210 Nicotine dependence, cigarettes, uncomplicated: Secondary | ICD-10-CM | POA: Insufficient documentation

## 2021-06-27 DIAGNOSIS — F419 Anxiety disorder, unspecified: Secondary | ICD-10-CM | POA: Insufficient documentation

## 2021-06-27 MED ORDER — FLUOXETINE HCL 40 MG PO CAPS: ORAL_CAPSULE | ORAL | 1 refills | Status: DC

## 2021-06-27 MED ORDER — FLUOXETINE HCL 20 MG PO CAPS
40.0000 mg | ORAL_CAPSULE | Freq: Every day | ORAL | Status: DC
  Administered 2021-06-27: 40 mg via ORAL
  Filled 2021-06-27: qty 2

## 2021-06-27 MED ORDER — FLUOXETINE HCL 40 MG PO CAPS
ORAL_CAPSULE | ORAL | 1 refills | Status: DC
  Filled 2021-06-27: qty 30, 30d supply, fill #0
  Filled 2021-08-11: qty 30, 30d supply, fill #1

## 2021-06-27 NOTE — ED Provider Notes (Signed)
[facility] Emergency Department Provider Note    Event Date/Time   First MD Initiated Contact with Patient 06/27/21 1136     (approximate)  I have reviewed the triage vital signs and the nursing notes.   HISTORY  Chief Complaint Medication Refill and Anxiety    HPI Seth Luna is a 25 y.o. male the below listed past medical history history of anxiety depression previously on 40 mg Prozac daily presents to the ER due to worsening anxiety.  States that he ran out of his Prozac 5 days ago.  He denies any SI or HI.  States he is having some trouble sleeping but folic he was well controlled on his Prozac.  Was unable to get refill this past week due to recently starting job where he works 9 until 7:00 has not had a chance to go the pharmacy.  Denies any hallucinations.  No substance abuse no other concerns.  Past Medical History:  Diagnosis Date   ADHD (attention deficit hyperactivity disorder)    Substance abuse ([facility])    No family history on file. 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 Authorizing Provider  amoxicillin (AMOXIL) 500 MG capsule Take 1 capsule (500 mg total) by mouth every 6 (six) hours until finished. 04/19/21     FLUoxetine (PROZAC) 40 MG capsule TAKE ONE CAPSULE BY MOUTH ONCE DAILY IN THE MORNING. 06/27/21   Willy Eddy, MD  ibuprofen (ADVIL) 600 MG tablet TAKE ONE TABLET BY MOUTH EVERY 6 HOURS AS NEEDED FOR PAIN. 04/19/21     mirtazapine (REMERON) 15 MG tablet TAKE (1/2) TABLET (7.5MG  TOTAL) BY MOUTH ONCE DAILY AT BEDTIME. 05/04/21       Allergies Haldol [haloperidol lactate]    Social History Social History   Tobacco Use   Smoking status: Some Days     Packs/day: 0.50    Types: Cigarettes   Smokeless tobacco: Never  Substance Use Topics   Alcohol use: Yes    Alcohol/week: 28.0 standard drinks    Types: 28 Cans of beer per week   Drug use: Not Currently    Types: Marijuana, Methamphetamines    Review of Systems Patient denies headaches, rhinorrhea, blurry vision, numbness, shortness of breath, chest pain, edema, cough, abdominal pain, nausea, vomiting, diarrhea, dysuria, fevers, rashes or hallucinations unless otherwise stated above in HPI. ____________________________________________   PHYSICAL EXAM:  VITAL SIGNS: Vitals:   06/27/21 1114  BP: 130/89  Pulse: 75  Resp: 16  Temp: 97.8 F (36.6 C)  SpO2: 98%    Constitutional: Alert and oriented. Well appearing and in no acute distress. Eyes: Conjunctivae are normal.  Head: Atraumatic. Nose: No congestion/rhinnorhea. Mouth/Throat: Mucous membranes are moist.   Neck: Painless ROM.  Cardiovascular:   Good peripheral circulation. Respiratory: Normal respiratory effort.  No retractions.  Gastrointestinal: Soft and nontender.  Musculoskeletal: No lower extremity tenderness .  No joint effusions. Neurologic:  Normal speech and language. No gross focal neurologic deficits are appreciated.  Skin:  Skin is warm, dry and intact. No rash noted. Psychiatric: Mood and affect are normal. Speech and behavior are normal.  ____________________________________________   LABS (all labs ordered are listed, but only abnormal results  are displayed)  No results found for this or any previous visit (from the past 24 hour(s)). __________________________________________________  RADIOLOGY   ____________________________________________   PROCEDURES  Procedure(s) performed:  Procedures    Critical Care performed: no ____________________________________________   INITIAL IMPRESSION / ASSESSMENT AND PLAN / ED COURSE  Pertinent labs & imaging results that were available during  my care of the patient were reviewed by me and considered in my medical decision making (see chart for details).   DDX: Psychosis, delirium, medication effect, noncompliance, polysubstance abuse, Si, Hi, depression   Seth Luna is a 25 y.o. who presents to the ED with symptoms as described above.  Patient well-appearing in no acute distress.  No SI or HI.  No criteria for IVC.  Clinically appears stable.  Will give dose of Prozac here as it is a holiday.  Refill sent to medication management.  Given referral for outpatient follow-up.  Patient agreeable to plan.    The patient was evaluated in Emergency Department today for the symptoms described in the history of present illness. He/she was evaluated in the context of the global COVID-19 pandemic, which necessitated consideration that the patient might be at risk for infection with the SARS-CoV-2 virus that causes COVID-19. Institutional protocols and algorithms that pertain to the evaluation of patients at risk for COVID-19 are in a state of rapid change based on information released by regulatory bodies including the CDC and federal and state organizations. These policies and algorithms were followed during the patient's care in the ED.   ____________________________________________   FINAL CLINICAL IMPRESSION(S) / ED DIAGNOSES  Final diagnoses:  Anxiety      NEW MEDICATIONS STARTED DURING THIS VISIT:  Current Discharge Medication List       Note:  This document was prepared using Dragon voice recognition software and may include unintentional dictation errors.     Willy Eddy, MD 06/27/21 1145

## 2021-06-27 NOTE — ED Triage Notes (Signed)
Pt to ED for withdrawal from Prozac. States he has not had in 5 days and is now feeling increased anxiety and trouble sleeping.

## 2021-06-28 ENCOUNTER — Other Ambulatory Visit: Payer: Self-pay

## 2021-07-04 ENCOUNTER — Other Ambulatory Visit: Payer: Self-pay

## 2021-07-04 MED ORDER — AMOXICILLIN 500 MG PO CAPS
500.0000 mg | ORAL_CAPSULE | Freq: Four times a day (QID) | ORAL | 0 refills | Status: DC
  Filled 2021-07-04: qty 28, 7d supply, fill #0

## 2021-07-04 MED ORDER — IBUPROFEN 600 MG PO TABS
600.0000 mg | ORAL_TABLET | Freq: Four times a day (QID) | ORAL | 0 refills | Status: DC | PRN
  Filled 2021-07-04: qty 28, 7d supply, fill #0

## 2021-08-11 ENCOUNTER — Other Ambulatory Visit: Payer: Self-pay

## 2021-08-12 ENCOUNTER — Other Ambulatory Visit: Payer: Self-pay

## 2022-02-24 ENCOUNTER — Other Ambulatory Visit: Payer: Self-pay

## 2022-04-20 ENCOUNTER — Emergency Department: Payer: Self-pay

## 2022-04-20 ENCOUNTER — Inpatient Hospital Stay
Admission: EM | Admit: 2022-04-20 | Discharge: 2022-04-21 | DRG: 917 | Disposition: A | Discharge status: Home / Self Care | Payer: Self-pay | Attending: Internal Medicine | Admitting: Internal Medicine

## 2022-04-20 ENCOUNTER — Inpatient Hospital Stay: Payer: Self-pay

## 2022-04-20 DIAGNOSIS — Y906 Blood alcohol level of 120-199 mg/100 ml: Secondary | ICD-10-CM | POA: Diagnosis present

## 2022-04-20 DIAGNOSIS — E870 Hyperosmolality and hypernatremia: Secondary | ICD-10-CM | POA: Diagnosis present

## 2022-04-20 DIAGNOSIS — J9601 Acute respiratory failure with hypoxia: Secondary | ICD-10-CM | POA: Diagnosis present

## 2022-04-20 DIAGNOSIS — F10120 Alcohol abuse with intoxication, uncomplicated: Secondary | ICD-10-CM | POA: Diagnosis present

## 2022-04-20 DIAGNOSIS — J9602 Acute respiratory failure with hypercapnia: Secondary | ICD-10-CM | POA: Diagnosis present

## 2022-04-20 DIAGNOSIS — Z79899 Other long term (current) drug therapy: Secondary | ICD-10-CM

## 2022-04-20 DIAGNOSIS — E876 Hypokalemia: Secondary | ICD-10-CM | POA: Diagnosis not present

## 2022-04-20 DIAGNOSIS — Z888 Allergy status to other drugs, medicaments and biological substances status: Secondary | ICD-10-CM

## 2022-04-20 DIAGNOSIS — J9600 Acute respiratory failure, unspecified whether with hypoxia or hypercapnia: Secondary | ICD-10-CM

## 2022-04-20 DIAGNOSIS — G928 Other toxic encephalopathy: Secondary | ICD-10-CM | POA: Diagnosis present

## 2022-04-20 DIAGNOSIS — T43012A Poisoning by tricyclic antidepressants, intentional self-harm, initial encounter: Principal | ICD-10-CM | POA: Diagnosis present

## 2022-04-20 DIAGNOSIS — F331 Major depressive disorder, recurrent, moderate: Secondary | ICD-10-CM

## 2022-04-20 DIAGNOSIS — F909 Attention-deficit hyperactivity disorder, unspecified type: Secondary | ICD-10-CM | POA: Diagnosis present

## 2022-04-20 DIAGNOSIS — T50902A Poisoning by unspecified drugs, medicaments and biological substances, intentional self-harm, initial encounter: Secondary | ICD-10-CM | POA: Diagnosis present

## 2022-04-20 DIAGNOSIS — T1491XA Suicide attempt, initial encounter: Secondary | ICD-10-CM

## 2022-04-20 DIAGNOSIS — F4310 Post-traumatic stress disorder, unspecified: Secondary | ICD-10-CM | POA: Diagnosis present

## 2022-04-20 DIAGNOSIS — F17210 Nicotine dependence, cigarettes, uncomplicated: Secondary | ICD-10-CM | POA: Diagnosis present

## 2022-04-20 LAB — CBC
HCT: 38 % — ABNORMAL LOW (ref 39.0–52.0)
Hemoglobin: 12.8 g/dL — ABNORMAL LOW (ref 13.0–17.0)
MCH: 29.6 pg (ref 26.0–34.0)
MCHC: 33.7 g/dL (ref 30.0–36.0)
MCV: 88 fL (ref 80.0–100.0)
Platelets: 231 10*3/uL (ref 150–400)
RBC: 4.32 MIL/uL (ref 4.22–5.81)
RDW: 13.2 % (ref 11.5–15.5)
WBC: 9.5 10*3/uL (ref 4.0–10.5)
nRBC: 0 % (ref 0.0–0.2)

## 2022-04-20 LAB — GLUCOSE, CAPILLARY
Glucose-Capillary: 118 mg/dL — ABNORMAL HIGH (ref 70–99)
Glucose-Capillary: 127 mg/dL — ABNORMAL HIGH (ref 70–99)
Glucose-Capillary: 119 mg/dL — ABNORMAL HIGH (ref 70–99)
Glucose-Capillary: 93 mg/dL (ref 70–99)
Glucose-Capillary: 99 mg/dL (ref 70–99)

## 2022-04-20 LAB — URINE DRUG SCREEN, QUALITATIVE (ARMC ONLY)
Amphetamines, Ur Screen: NOT DETECTED
Barbiturates, Ur Screen: NOT DETECTED
Benzodiazepine, Ur Scrn: POSITIVE — AB
Cannabinoid 50 Ng, Ur ~~LOC~~: NOT DETECTED
Cocaine Metabolite,Ur ~~LOC~~: NOT DETECTED
MDMA (Ecstasy)Ur Screen: NOT DETECTED
Methadone Scn, Ur: NOT DETECTED
Opiate, Ur Screen: NOT DETECTED
Phencyclidine (PCP) Ur S: NOT DETECTED
Tricyclic, Ur Screen: POSITIVE — AB

## 2022-04-20 LAB — BLOOD GAS, ARTERIAL
Acid-Base Excess: 0.8 mmol/L (ref 0.0–2.0)
Bicarbonate: 29.1 mmol/L — ABNORMAL HIGH (ref 20.0–28.0)
FIO2: 30 %
MECHVT: 400 mL
Mechanical Rate: 18
O2 Saturation: 98.9 %
PEEP: 5 cmH2O
Patient temperature: 37
pCO2 arterial: 62 mmHg — ABNORMAL HIGH (ref 32–48)
pH, Arterial: 7.28 — ABNORMAL LOW (ref 7.35–7.45)
pO2, Arterial: 128 mmHg — ABNORMAL HIGH (ref 83–108)

## 2022-04-20 LAB — COMPREHENSIVE METABOLIC PANEL
ALT: 13 U/L (ref 0–44)
AST: 15 U/L (ref 15–41)
Albumin: 3.1 g/dL — ABNORMAL LOW (ref 3.5–5.0)
Alkaline Phosphatase: 46 U/L (ref 38–126)
Anion gap: 4 — ABNORMAL LOW (ref 5–15)
BUN: 6 mg/dL (ref 6–20)
CO2: 30 mmol/L (ref 22–32)
Calcium: 7.1 mg/dL — ABNORMAL LOW (ref 8.9–10.3)
Chloride: 112 mmol/L — ABNORMAL HIGH (ref 98–111)
Creatinine, Ser: 0.75 mg/dL (ref 0.61–1.24)
GFR, Estimated: >60 mL/min (ref >60)
Glucose, Bld: 103 mg/dL — ABNORMAL HIGH (ref 70–99)
Potassium: 3.4 mmol/L — ABNORMAL LOW (ref 3.5–5.1)
Sodium: 146 mmol/L — ABNORMAL HIGH (ref 135–145)
Total Bilirubin: 0.2 mg/dL — ABNORMAL LOW (ref 0.3–1.2)
Total Protein: 5.4 g/dL — ABNORMAL LOW (ref 6.5–8.1)

## 2022-04-20 LAB — PHOSPHORUS: Phosphorus: 4 mg/dL (ref 2.5–4.6)

## 2022-04-20 LAB — CBC WITH DIFFERENTIAL/PLATELET
Abs Immature Granulocytes: 0.02 10*3/uL (ref 0.00–0.07)
Basophils Absolute: 0 10*3/uL (ref 0.0–0.1)
Basophils Relative: 1 %
Eosinophils Absolute: 0.1 10*3/uL (ref 0.0–0.5)
Eosinophils Relative: 2 %
HCT: 34.8 % — ABNORMAL LOW (ref 39.0–52.0)
Hemoglobin: 11.6 g/dL — ABNORMAL LOW (ref 13.0–17.0)
Immature Granulocytes: 0 %
Lymphocytes Relative: 35 %
Lymphs Abs: 2.3 10*3/uL (ref 0.7–4.0)
MCH: 29.3 pg (ref 26.0–34.0)
MCHC: 33.3 g/dL (ref 30.0–36.0)
MCV: 87.9 fL (ref 80.0–100.0)
Monocytes Absolute: 0.4 10*3/uL (ref 0.1–1.0)
Monocytes Relative: 6 %
Neutro Abs: 3.6 10*3/uL (ref 1.7–7.7)
Neutrophils Relative %: 56 %
Platelets: 222 10*3/uL (ref 150–400)
RBC: 3.96 MIL/uL — ABNORMAL LOW (ref 4.22–5.81)
RDW: 13.3 % (ref 11.5–15.5)
WBC: 6.5 10*3/uL (ref 4.0–10.5)
nRBC: 0 % (ref 0.0–0.2)

## 2022-04-20 LAB — URINALYSIS, ROUTINE W REFLEX MICROSCOPIC
Bilirubin Urine: NEGATIVE
Glucose, UA: NEGATIVE mg/dL
Hgb urine dipstick: NEGATIVE
Ketones, ur: NEGATIVE mg/dL
Leukocytes,Ua: NEGATIVE
Nitrite: NEGATIVE
Protein, ur: NEGATIVE mg/dL
Specific Gravity, Urine: 1.006 (ref 1.005–1.030)
pH: 7 (ref 5.0–8.0)

## 2022-04-20 LAB — ACETAMINOPHEN LEVEL
Acetaminophen (Tylenol), Serum: <10 ug/mL — ABNORMAL LOW (ref 10–30)
Acetaminophen (Tylenol), Serum: <10 ug/mL — ABNORMAL LOW (ref 10–30)

## 2022-04-20 LAB — BASIC METABOLIC PANEL
Anion gap: 5 (ref 5–15)
BUN: <5 mg/dL — ABNORMAL LOW (ref 6–20)
CO2: 28 mmol/L (ref 22–32)
Calcium: 7.1 mg/dL — ABNORMAL LOW (ref 8.9–10.3)
Chloride: 114 mmol/L — ABNORMAL HIGH (ref 98–111)
Creatinine, Ser: 0.73 mg/dL (ref 0.61–1.24)
GFR, Estimated: >60 mL/min (ref >60)
Glucose, Bld: 108 mg/dL — ABNORMAL HIGH (ref 70–99)
Potassium: 4.1 mmol/L (ref 3.5–5.1)
Sodium: 147 mmol/L — ABNORMAL HIGH (ref 135–145)

## 2022-04-20 LAB — LACTIC ACID, PLASMA
Lactic Acid, Venous: 1.2 mmol/L (ref 0.5–1.9)
Lactic Acid, Venous: 1.5 mmol/L (ref 0.5–1.9)

## 2022-04-20 LAB — TROPONIN I (HIGH SENSITIVITY)
Troponin I (High Sensitivity): <2 ng/L (ref <18)
Troponin I (High Sensitivity): 3 ng/L (ref <18)

## 2022-04-20 LAB — SALICYLATE LEVEL: Salicylate Lvl: <7 mg/dL — ABNORMAL LOW (ref 7.0–30.0)

## 2022-04-20 LAB — MRSA NEXT GEN BY PCR, NASAL: MRSA by PCR Next Gen: NOT DETECTED

## 2022-04-20 LAB — MAGNESIUM: Magnesium: 2.1 mg/dL (ref 1.7–2.4)

## 2022-04-20 LAB — ETHANOL: Alcohol, Ethyl (B): 185 mg/dL — ABNORMAL HIGH (ref <10)

## 2022-04-20 LAB — HIV ANTIBODY (ROUTINE TESTING W REFLEX): HIV Screen 4th Generation wRfx: NONREACTIVE

## 2022-04-20 MED ORDER — NICOTINE 14 MG/24HR TD PT24
14.0000 mg | MEDICATED_PATCH | Freq: Every day | TRANSDERMAL | Status: DC
  Administered 2022-04-20 – 2022-04-21 (×2): 14 mg via TRANSDERMAL
  Filled 2022-04-20 (×2): qty 1

## 2022-04-20 MED ORDER — SUCCINYLCHOLINE CHLORIDE 200 MG/10ML IV SOSY: 150.0000 mg | PREFILLED_SYRINGE | Freq: Once | INTRAVENOUS | Status: AC

## 2022-04-20 MED ORDER — PROPOFOL 1000 MG/100ML IV EMUL
INTRAVENOUS | Status: AC
  Administered 2022-04-20: 5 ug/kg/min via INTRAVENOUS
  Filled 2022-04-20: qty 100

## 2022-04-20 MED ORDER — ENOXAPARIN SODIUM 40 MG/0.4ML IJ SOSY
40.0000 mg | PREFILLED_SYRINGE | INTRAMUSCULAR | Status: DC
Start: 2022-04-20 — End: 2022-04-21
  Administered 2022-04-20 – 2022-04-21 (×2): 40 mg via SUBCUTANEOUS
  Filled 2022-04-20 (×2): qty 0.4

## 2022-04-20 MED ORDER — ETOMIDATE 2 MG/ML IV SOLN
INTRAVENOUS | Status: AC
  Administered 2022-04-20: 20 mg via INTRAVENOUS
  Filled 2022-04-20: qty 10

## 2022-04-20 MED ORDER — SODIUM BICARBONATE 8.4 % IV SOLN
INTRAVENOUS | Status: AC
  Administered 2022-04-20: 50 meq via INTRAVENOUS
  Filled 2022-04-20: qty 50

## 2022-04-20 MED ORDER — LACTATED RINGERS IV BOLUS
1000.0000 mL | Freq: Once | INTRAVENOUS | Status: AC
  Administered 2022-04-20: 1000 mL via INTRAVENOUS

## 2022-04-20 MED ORDER — SODIUM BICARBONATE 8.4 % IV SOLN: 50.0000 meq | Freq: Once | INTRAVENOUS | Status: AC

## 2022-04-20 MED ORDER — SODIUM CHLORIDE 0.9 % IV BOLUS
1000.0000 mL | Freq: Once | INTRAVENOUS | Status: AC
  Administered 2022-04-20: 1000 mL via INTRAVENOUS

## 2022-04-20 MED ORDER — ESCITALOPRAM OXALATE 10 MG PO TABS
5.0000 mg | ORAL_TABLET | Freq: Every day | ORAL | Status: DC
  Administered 2022-04-21: 5 mg via ORAL
  Filled 2022-04-20: qty 1

## 2022-04-20 MED ORDER — CHLORHEXIDINE GLUCONATE CLOTH 2 % EX PADS
6.0000 | MEDICATED_PAD | Freq: Every day | CUTANEOUS | Status: DC
  Administered 2022-04-20: 6 via TOPICAL

## 2022-04-20 MED ORDER — PANTOPRAZOLE 2 MG/ML SUSPENSION
40.0000 mg | Freq: Every day | ORAL | Status: DC
  Filled 2022-04-20 (×3): qty 20

## 2022-04-20 MED ORDER — POLYETHYLENE GLYCOL 3350 17 G PO PACK: 17.0000 g | PACK | Freq: Every day | ORAL | Status: DC | PRN

## 2022-04-20 MED ORDER — PROPOFOL 1000 MG/100ML IV EMUL
5.0000 ug/kg/min | INTRAVENOUS | Status: DC
  Filled 2022-04-20: qty 100

## 2022-04-20 MED ORDER — DIPHENHYDRAMINE HCL 50 MG/ML IJ SOLN
25.0000 mg | Freq: Once | INTRAMUSCULAR | Status: AC
  Administered 2022-04-20: 25 mg via INTRAVENOUS
  Filled 2022-04-20: qty 1

## 2022-04-20 MED ORDER — LIDOCAINE HCL (CARDIAC) PF 100 MG/5ML IV SOSY
PREFILLED_SYRINGE | INTRAVENOUS | Status: AC
  Administered 2022-04-20: 5 mL via INTRAVENOUS
  Filled 2022-04-20: qty 5

## 2022-04-20 MED ORDER — ETOMIDATE 2 MG/ML IV SOLN: 20.0000 mg | Freq: Once | INTRAVENOUS | Status: AC

## 2022-04-20 MED ORDER — SUCCINYLCHOLINE CHLORIDE 200 MG/10ML IV SOSY
PREFILLED_SYRINGE | INTRAVENOUS | Status: AC
  Administered 2022-04-20: 150 mg via INTRAVENOUS
  Filled 2022-04-20: qty 10

## 2022-04-20 MED ORDER — LIDOCAINE HCL (CARDIAC) PF 100 MG/5ML IV SOSY: 1.0000 mg/kg | PREFILLED_SYRINGE | Freq: Once | INTRAVENOUS | Status: AC

## 2022-04-20 MED ORDER — DOCUSATE SODIUM 50 MG/5ML PO LIQD: 100.0000 mg | Freq: Two times a day (BID) | ORAL | Status: DC | PRN

## 2022-04-20 NOTE — H&P (Addendum)
NAME:  Seth Luna, MRN:  [id number], DOB:  1996/08/19, LOS: 0 ADMISSION DATE:  04/20/2022, CONSULTATION DATE:  04/20/22 REFERRING MD:  Lurline Hare MD  CHIEF COMPLAINT:  Drug Overdose    HPI  26 y.o male with significant PMH of ADHD, anxiety and depression, Bipolar Disorder, and polysubstance abuse who presented to the ED with chief complaints of drug overdose and alcohol intoxication.  Per EMS report, patient's family report reports patient overdosed on Suboxone and amitriptyline.  Patient's significant other IV reported that patient was drinking when they had a minor argument prompting the patient to take his prescription Suboxone and about 6-8 tabs of amitriptyline.  Patient received 2 mg intranasal Narcan per family, and additional 2 mg intranasal Narcan by police and 4 mg IV Narcan by EMS with no improvement.  Patient also received 1 amp of sodium bicarb per EMS for suspected TCA overdose prior to being transported to the ED.  ED Course: In the emergency department, the temperature was 35.2C, the heart rate 109 beats/minute, the blood pressure 126/95 mm Hg, the respiratory rate 25 breaths/minute, and the oxygen saturation 100% on BVM.  Patient was intubated for airway protection given he had an absent gag reflex. Pertinent Labs /Diagnostics Findings: Na+/ K+: 146/3.4 otherwise unremarkable CMP, CBC with slightly decreased H&H otherwise unremarkable.  Acetaminophen levels [id number], salicylate 0000000, ethanol 185, UDS positive for TCA and benzos. Arterial Blood Gas result:  pO2 128; pCO2 62; pH 7.28;  HCO3 29.1, %O2 Sat 98.9  Stat CT head was obtained and showed no acute intracranial abnormality.  Patient was IVC by EDP and PCCM consulted for admission to the ICU and further management.  Past Medical History  ADHD, anxiety and depression, Bipolar Disorder, and polysubstance abuse  Significant Hospital Events   6/29: Admitted to the ICU with intentional drug overdose requiring  intubation  Consults:  Psychiatry  Procedures:  6/29: Intubation  Significant Diagnostic Tests:  6/29: Chest Xray> evidence of active pulmonary disease 6/29: Noncontrast CT head> no acute intracranial abnormality  Micro Data:  None  Antimicrobials:  None  OBJECTIVE  Blood pressure (!) 149/106, pulse (!) 109, temperature (!) 95.1 F (35.1 C), resp. rate (!) 29, height 5\' 2"  (1.575 m), weight 63 kg, SpO2 100 %.    Vent Mode: AC FiO2 (%):  [30 %] 30 % Set Rate:  [18 bmp] 18 bmp Vt Set:  [400 mL] 400 mL PEEP:  [5 cmH20] 5 cmH20  No intake or output data in the 24 hours ending 04/20/22 0422 Filed Weights   04/20/22 0335  Weight: 63 kg   Physical Examination  GENERAL: 26 year-old critically ill patient lying in the bed intubated mechanically ventilated and sedated EYES: Pupils equal, round, nonreactive to light and accommodation. No scleral icterus. Extraocular muscles intact.  HEENT: Head atraumatic, normocephalic. Oropharynx and nasopharynx clear.  NECK:  Supple, no jugular venous distention. No thyroid enlargement, no tenderness.  LUNGS: Normal breath sounds bilaterally, no wheezing, rales,rhonchi or crepitation. No use of accessory muscles of respiration.  CARDIOVASCULAR: S1, S2 normal. No murmurs, rubs, or gallops.  ABDOMEN: Soft, nontender, nondistended. Bowel sounds present. No organomegaly or mass.  EXTREMITIES: No pedal edema, cyanosis, or clubbing.  NEUROLOGIC: Cranial nerves II through XII are intact except weak cough and gag, absent corneal reflex.  Withdraws bilateral upper extremity to noxious stimuli.  Extends bilateral lower extremity to noxious stimuli.  Unable to assess muscle strength. Gait not checked.  PSYCHIATRIC: The patient is intubated and  sedated SKIN: No obvious rash, lesion, or ulcer.   Labs/imaging that I havepersonally reviewed  (right click and "Reselect all SmartList Selections" daily)     Labs   CBC: Recent Labs  Lab 04/20/22 0328  WBC  6.5  NEUTROABS 3.6  HGB 11.6*  HCT 34.8*  MCV 87.9  PLT 222    Basic Metabolic Panel: Recent Labs  Lab 04/20/22 0328  NA 146*  K 3.4*  CL 112*  CO2 30  GLUCOSE 103*  BUN 6  CREATININE 0.75  CALCIUM 7.1*   GFR: Estimated Creatinine Clearance: 108.1 mL/min (by C-G formula based on SCr of 0.75 mg/dL). Recent Labs  Lab 04/20/22 0328  WBC 6.5  LATICACIDVEN 1.2    Liver Function Tests: Recent Labs  Lab 04/20/22 0328  AST 15  ALT 13  ALKPHOS 46  BILITOT 0.2*  PROT 5.4*  ALBUMIN 3.1*   No results for input(s): "LIPASE", "AMYLASE" in the last 168 hours. No results for input(s): "AMMONIA" in the last 168 hours.  ABG No results found for: "PHART", "PCO2ART", "PO2ART", "HCO3", "TCO2", "ACIDBASEDEF", "O2SAT"   Coagulation Profile: No results for input(s): "INR", "PROTIME" in the last 168 hours.  Cardiac Enzymes: No results for input(s): "CKTOTAL", "CKMB", "CKMBINDEX", "TROPONINI" in the last 168 hours.  HbA1C: No results found for: "HGBA1C"  CBG: No results for input(s): "GLUCAP" in the last 168 hours.  Review of Systems:   UNABLE TO OBTAIN DUE TO PATIENT BEING INTUBATED AND SEDATED  Past Medical History  He,  has a past medical history of ADHD (attention deficit hyperactivity disorder) and Substance abuse ([facility]).   Surgical History    Past Surgical History:  Procedure Laterality Date   FOOT SURGERY       Social History   reports that he has been smoking cigarettes. He has been smoking an average of .5 packs per day. He has never used smokeless tobacco. He reports current alcohol use of about 28.0 standard drinks of alcohol per week. He reports that he does not currently use drugs after having used the following drugs: Marijuana and Methamphetamines.   Family History   His family history is not on file.   Allergies Allergies  Allergen Reactions   Haldol [Haloperidol Lactate] Other (See Comments)    Dystonic reaction     Home Medications  Prior  to Admission medications   Medication Sig Start Date End Date Taking? Authorizing Provider  amoxicillin (AMOXIL) 500 MG capsule Take 1 capsule (500 mg total) by mouth every 6 (six) hours until finished 07/04/21     FLUoxetine (PROZAC) 40 MG capsule TAKE ONE CAPSULE BY MOUTH ONCE DAILY IN THE MORNING. 06/27/21   Willy Eddy, MD  ibuprofen (ADVIL) 600 MG tablet Take 1 tablet (600 mg total) by mouth every 6 (six) hours as needed for pain 07/04/21     mirtazapine (REMERON) 15 MG tablet TAKE (1/2) TABLET (7.5MG  TOTAL) BY MOUTH ONCE DAILY AT BEDTIME. 05/04/21       Scheduled Meds:  enoxaparin (LOVENOX) injection  40 mg Subcutaneous Q24H   pantoprazole sodium  40 mg Per Tube Daily   Continuous Infusions:  propofol (DIPRIVAN) infusion     PRN Meds:.docusate, polyethylene glycol   Active Hospital Problem list     Assessment & Plan:  Acute Hypoxic Respiratory failure in the setting of Intentional Drug Overdose and EtOH intoxication Suspected patient ingested 6-8 tabs of amitriptyline and unknown number of Suboxone+ EtOH use -continue ventilator support & lung protective strategies -Wean PEEP &  FiO2 as tolerated, maintain SpO2 > 90% -Head of bed elevated 30 degrees, VAP protocol in place -Plateau pressures less than 30 cm H20 -Intermittent chest x-ray & ABG PRN -Daily WUA with SBT as tolerated -Ensure adequate pulmonary hygiene  -Sedation for RASS goal 0   Toxic-metabolic encephalopathy secondary to Intentional Drug Overdose and EtOH intoxication -CTH negative for acute intracranial abnormality -seizure precautions -monitor CBG q 4 -will need sitter/ SI precautions and psych consult after acute illness/ extubation - Poison Control Hotline, [phone]   Polysubstance abuse Hx of EtOH abuse , Benzo and Narcotic use -Acetaminophen levels <10, salicylate levels<7.0, ethanol 185, UDS positive for TCA and benzos -Follow CMP, INR, Daily BMP+Mg -Daily Thiamine, Folate, MVI once  tolerating PO -SW consult for cessation resources once stable -PT/OT evaluation for mobility -Follows with Suboxone clinic -Psych consult as above  Anxiety and Depression Bipolar Disorder? Hx: PTSD -Hold home meds for now.    Best practice:  Diet:  NPO Pain/Anxiety/Delirium protocol (if indicated): Yes (RASS goal 0) VAP protocol (if indicated): Yes DVT prophylaxis: LMWH GI prophylaxis: PPI Glucose control:  SSI Yes Central venous access:  N/A Arterial line:  N/A Foley:  Yes, and it is still needed Mobility:  bed rest  PT consulted: N/A Last date of multidisciplinary goals of care discussion [6/29] Code Status:  full code Disposition: ICU   = Goals of Care = Code Status Order: FULL  Primary Emergency Contact: Hight, Ivy Wishes to pursue full aggressive treatment and intervention options, including CPR and intubation, but goals of care will be addressed on going with family if that should become necessary.   Critical care time: 45 minutes     Webb Silversmith, DNP, CCRN, FNP-C, AGACNP-BC Acute Care Nurse Practitioner  [facility] Pager: [phone] [place] [facility]

## 2022-04-20 NOTE — Procedures (Signed)
Central Venous Catheter Insertion Procedure Note  KEVIS QU  [id number]  November 27, 1995  Date:04/20/22  Time:7:26 AM   Provider Performing:Shaquaya Wuellner A Shamicka Inga   Procedure: Insertion of Non-tunneled Central Venous Catheter(36556) with US guidance (73567)   Indication(s) Medication administration  Consent Unable to obtain consent due to emergent nature of procedure.  Anesthesia Topical only with 1% lidocaine   Timeout Verified patient identification, verified procedure, site/side was marked, verified correct patient position, special equipment/implants available, medications/allergies/relevant history reviewed, required imaging and test results available.  Sterile Technique Maximal sterile technique including full sterile barrier drape, hand hygiene, sterile gown, sterile gloves, mask, hair covering, sterile ultrasound probe cover (if used).  Procedure Description Area of catheter insertion was cleaned with chlorhexidine and draped in sterile fashion.  With real-time ultrasound guidance a central venous catheter was placed into the right internal jugular vein. Nonpulsatile blood flow and easy flushing noted in all ports.  The catheter was sutured in place and sterile dressing applied.  Complications/Tolerance None; patient tolerated the procedure well. Chest X-ray is ordered to verify placement for internal jugular or subclavian cannulation.   Chest x-ray is not ordered for femoral cannulation.  EBL Minimal  Specimen(s) None   Webb Silversmith, DNP, CCRN, FNP-C, AGACNP-BC Acute Care & Family Nurse Practitioner  [facility]  See Amion for personal pager PCCM on call pager [phone] until 7 am

## 2022-04-20 NOTE — Progress Notes (Signed)
Pt transported to CT and returned to ED on the vent without incident. Pt remains on the vent and is tol well at this time.

## 2022-04-20 NOTE — IPAL (Signed)
  Interdisciplinary Goals of Care Family Meeting   Date carried out: 04/20/2022  Location of the meeting: Bedside  Member's involved: Physician and Family Member or next of kin    GOALS OF CARE DISCUSSION  The Clinical status was relayed to family in detail-Mother at bedside  Updated and notified of patients medical condition- Patient remains unresponsive and will not open eyes to command.   Patient is having a weak cough and struggling to remove secretions.   Patient with increased WOB and using accessory muscles to breathe Explained to family course of therapy and the modalities   Patient with Progressive multiorgan failure with a very high probablity of a very minimal chance of meaningful recovery despite all aggressive and optimal medical therapy.  PATIENT REMAINS FULL CODE  Acute drug overdose and suicide attempt leading to severe metabolic encephalopathy and acidosis leading to severe respiratory failure Findings explained to mom at bedside Continue supportive care Continue monitor neurological status Poison control contacted Family understands the situation.   Family are satisfied with Plan of action and management. All questions answered  Additional CC time 25 mins   Kire Ferg Santiago Glad, M.D.  [facility]  Medical Director [facility] Medical Director [facility] Cardio-Pulmonary Department

## 2022-04-20 NOTE — ED Provider Notes (Signed)
[facility] Provider Note    Event Date/Time   First MD Initiated Contact with Patient 04/20/22 0330     (approximate)   History   Drug Overdose  Level V caveat: Limited by unresponsiveness  HPI  Seth Luna is a 26 y.o. male brought to the ED via EMS from home with a chief complaint of overdose.  Family reports patient overdosed on Suboxone and amitriptyline.  Received 2 mg intranasal Narcan per family, 2 mg intranasal Narcan per police and 4 mg IV Narcan by EMS with no improvement.  Patient also received 1 amp of sodium bicarbonate per EMS for suspected TCA overdose.     Past Medical History   Past Medical History:  Diagnosis Date   ADHD (attention deficit hyperactivity disorder)    Substance abuse [facility])      Active Problem List   Patient Active Problem List   Diagnosis Date Noted   Intentional drug overdose ([place]) 04/20/2022   Dystonic drug reaction 02/22/2019   Acute encephalopathy 02/22/2019   Substance abuse ([place]) 02/22/2019     Past Surgical History   Past Surgical History:  Procedure Laterality Date   FOOT SURGERY       Home Medications   Prior to Admission medications   Medication Sig Start Date End Date Taking? Authorizing Provider  amoxicillin (AMOXIL) 500 MG capsule Take 1 capsule (500 mg total) by mouth every 6 (six) hours until finished 07/04/21     buPROPion (WELLBUTRIN SR) 150 MG 12 hr tablet Take by mouth. Take 150 mg by mouth daily for one week. Then increase to 2 times daily. 02/02/22   [provider]  FLUoxetine (PROZAC) 40 MG capsule TAKE ONE CAPSULE BY MOUTH ONCE DAILY IN THE MORNING. 06/27/21   Merlyn Lot, MD  ibuprofen (ADVIL) 600 MG tablet Take 1 tablet (600 mg total) by mouth every 6 (six) hours as needed for pain 07/04/21     mirtazapine (REMERON) 15 MG tablet TAKE (1/2) TABLET (7.5MG TOTAL) BY MOUTH ONCE DAILY AT BEDTIME. 05/04/21        Allergies  Haldol [haloperidol  lactate]   Family History  No family history on file.   Physical Exam  Triage Vital Signs: ED Triage Vitals  Enc Vitals Group     BP 04/20/22 0330 96/75     Pulse Rate 04/20/22 0330 (!) 111     Resp 04/20/22 0330 20     Temp --      Temp src --      SpO2 04/20/22 0330 100 %     Weight 04/20/22 0335 138 lb 14.2 oz (63 kg)     Height 04/20/22 0335 _0  (1.575 m)     Head Circumference --      Peak Flow --      Pain Score --      Pain Loc --      Pain Edu? --      Excl. in [place]? --     Updated Vital Signs: BP (!) 137/100 (BP Location: Left Arm)   Pulse (!) 108   Temp (!) 95.9 F (35.5 C) (Bladder)   Resp 18   Ht _1  (1.626 m)   Wt 58.1 kg   SpO2 100%   BMI 21.99 kg/m    General: Unresponsive.  CV:  Tachycardic.  Good peripheral perfusion.  Resp:  Normal effort.  Bibasilar rales Abd:  Soft, no distention.  Other:  Pupils 3 mm and sluggishly  reactive.  No gag reflex.  No response to sternal rub   ED Results / Procedures / Treatments  Labs (all labs ordered are listed, but only abnormal results are displayed) Labs Reviewed  CBC WITH DIFFERENTIAL/PLATELET - Abnormal; Notable for the following components:      Result Value   RBC 3.96 (*)    Hemoglobin 11.6 (*)    HCT 34.8 (*)    All other components within normal limits  COMPREHENSIVE METABOLIC PANEL - Abnormal; Notable for the following components:   Sodium 146 (*)    Potassium 3.4 (*)    Chloride 112 (*)    Glucose, Bld 103 (*)    Calcium 7.1 (*)    Total Protein 5.4 (*)    Albumin 3.1 (*)    Total Bilirubin 0.2 (*)    Anion gap 4 (*)    All other components within normal limits  ACETAMINOPHEN LEVEL - Abnormal; Notable for the following components:   Acetaminophen (Tylenol), Serum <10 (*)    All other components within normal limits  SALICYLATE LEVEL - Abnormal; Notable for the following components:   Salicylate Lvl <7.8 (*)    All other components within normal limits  URINE DRUG SCREEN,  QUALITATIVE ([facility] ONLY) - Abnormal; Notable for the following components:   Tricyclic, Ur Screen POSITIVE (*)    Benzodiazepine, Ur Scrn POSITIVE (*)    All other components within normal limits  URINALYSIS, ROUTINE W REFLEX MICROSCOPIC - Abnormal; Notable for the following components:   Color, Urine STRAW (*)    APPearance CLEAR (*)    All other components within normal limits  ETHANOL - Abnormal; Notable for the following components:   Alcohol, Ethyl (B) 185 (*)    All other components within normal limits  BLOOD GAS, ARTERIAL - Abnormal; Notable for the following components:   pH, Arterial 7.28 (*)    pCO2 arterial 62 (*)    pO2, Arterial 128 (*)    Bicarbonate 29.1 (*)    All other components within normal limits  MRSA NEXT GEN BY PCR, NASAL  LACTIC ACID, PLASMA  LACTIC ACID, PLASMA  HIV ANTIBODY (ROUTINE TESTING W REFLEX)  CBC  BASIC METABOLIC PANEL  MAGNESIUM  PHOSPHORUS  TROPONIN I (HIGH SENSITIVITY)  TROPONIN I (HIGH SENSITIVITY)     EKG  ED ECG REPORT I, Eugean Arnott J, the attending physician, personally viewed and interpreted this ECG.   Date: 04/20/2022  EKG Time: 0329  Rate: 112  Rhythm: sinus tachycardia  Axis: Normal  Intervals:none  ST&T Change: Nonspecific    RADIOLOGY I have independently visualized and interpreted patient's CT head and chest x-ray as well as noted the radiology interpretation:  Chest x-ray: Shallow inspiration otherwise unremarkable  CT head: No ICH  Official radiology report(s): CT HEAD WO CONTRAST (5MM)  Result Date: 04/20/2022 CLINICAL DATA:  Altered mental status, drug overdose EXAM: CT HEAD WITHOUT CONTRAST TECHNIQUE: Contiguous axial images were obtained from the base of the skull through the vertex without intravenous contrast. RADIATION DOSE REDUCTION: This exam was performed according to the departmental dose-optimization program which includes automated exposure control, adjustment of the mA and/or kV according to  patient size and/or use of iterative reconstruction technique. COMPARISON:  None Available. FINDINGS: Brain: Normal anatomic configuration. No abnormal intra or extra-axial mass lesion or fluid collection. No abnormal mass effect or midline shift. No evidence of acute intracranial hemorrhage or infarct. Ventricular size is normal. Cerebellum unremarkable. Vascular: Unremarkable Skull: Intact Sinuses/Orbits: Paranasal sinuses are clear.  Orbits are unremarkable. Other: Mastoid air cells and middle ear cavities are clear. IMPRESSION: No acute intracranial abnormality. Electronically Signed   By: Fidela Salisbury M.D.   On: 04/20/2022 04:06   DG Chest Portable 1 View  Result Date: 04/20/2022 CLINICAL DATA:  Intubation and OG tube placement post overdose. EXAM: PORTABLE CHEST 1 VIEW COMPARISON:  05/28/2020 FINDINGS: Endotracheal tube placed with tip measuring 1.8 cm above the carina. Enteric tube placed with tip in the left upper quadrant consistent with location in the upper stomach. Shallow inspiration. Heart size and pulmonary vascularity are normal. Lungs are clear. No pleural effusions. No pneumothorax. Mediastinal contours appear intact. IMPRESSION: Appliances appear in satisfactory position. Shallow inspiration. No evidence of active pulmonary disease. Electronically Signed   By: Lucienne Capers M.D.   On: 04/20/2022 03:54     PROCEDURES:  Critical Care performed: Yes, see critical care procedure note(s)  CRITICAL CARE Performed by: Paulette Blanch   Total critical care time: 60 minutes  Critical care time was exclusive of separately billable procedures and treating other patients.  Critical care was necessary to treat or prevent imminent or life-threatening deterioration.  Critical care was time spent personally by me on the following activities: development of treatment plan with patient and/or surrogate as well as nursing, discussions with consultants, evaluation of patient's response to  treatment, examination of patient, obtaining history from patient or surrogate, ordering and performing treatments and interventions, ordering and review of laboratory studies, ordering and review of radiographic studies, pulse oximetry and re-evaluation of patient's condition.   Marland Kitchen1-3 Lead EKG Interpretation  Performed by: Paulette Blanch, MD Authorized by: Paulette Blanch, MD     Interpretation: abnormal     ECG rate:  112   ECG rate assessment: tachycardic     Rhythm: sinus tachycardia     Ectopy: none     Conduction: normal   Comments:     Patient placed on cardiac monitor to evaluate for arrhythmias Procedure Name: Intubation Date/Time: 04/20/2022 3:51 AM  Performed by: Paulette Blanch, MDPre-anesthesia Checklist: Patient identified, Patient being monitored, Emergency Drugs available, Timeout performed and Suction available Oxygen Delivery Method: Ambu bag Preoxygenation: Pre-oxygenation with 100% oxygen Induction Type: Rapid sequence Ventilation: Mask ventilation without difficulty Laryngoscope Size: Mac and 3 Grade View: Grade I Tube size: 7.5 mm Number of attempts: 1 Airway Equipment and Method: Rigid stylet Placement Confirmation: ETT inserted through vocal cords under direct vision, CO2 detector and Breath sounds checked- equal and bilateral Dental Injury: Teeth and Oropharynx as per pre-operative assessment        MEDICATIONS ORDERED IN ED: Medications  propofol (DIPRIVAN) 1000 MG/100ML infusion (5 mcg/kg/min  58 kg (Adjusted) Intravenous New Bag/Given 04/20/22 0517)  docusate (COLACE) 50 MG/5ML liquid 100 mg (has no administration in time range)  polyethylene glycol (MIRALAX / GLYCOLAX) packet 17 g (has no administration in time range)  enoxaparin (LOVENOX) injection 40 mg (has no administration in time range)  pantoprazole sodium (PROTONIX) 40 mg/20 mL oral suspension 40 mg (has no administration in time range)  Chlorhexidine Gluconate Cloth 2 % PADS 6 each (has no  administration in time range)  sodium chloride 0.9 % bolus 1,000 mL (1,000 mLs Intravenous New Bag/Given 04/20/22 0340)  sodium chloride 0.9 % bolus 1,000 mL (1,000 mLs Intravenous New Bag/Given 04/20/22 0340)  lidocaine (cardiac) 100 mg/75m (XYLOCAINE) injection 2% 63 mg (5 mLs Intravenous Given 04/20/22 0330)  etomidate (AMIDATE) injection 20 mg (20 mg Intravenous Given 04/20/22 0331)  succinylcholine (  ANECTINE) syringe 150 mg (150 mg Intravenous Given 04/20/22 0332)     IMPRESSION / MDM / ASSESSMENT AND PLAN / ED COURSE  I reviewed the triage vital signs and the nursing notes.                             26 year old male presents with unresponsive nature; suspected Suboxone and amitriptyline overdose. Differential diagnosis includes, but is not limited to, alcohol, illicit or prescription medications, or other toxic ingestion; intracranial pathology such as stroke or intracerebral hemorrhage; fever or infectious causes including sepsis; hypoxemia and/or hypercarbia; uremia; trauma; endocrine related disorders such as diabetes, hypoglycemia, and thyroid-related diseases; hypertensive encephalopathy; etc. I have personally reviewed patient's records and see that he had a last visit to our ED in September 2022 for anxiety.  It is noted he has a history of ADHD and substance abuse.  Patient's presentation is most consistent with acute presentation with potential threat to life or bodily function.  The patient is on the cardiac monitor to evaluate for evidence of arrhythmia and/or significant heart rate changes.  We will obtain toxicological lab work and urine, chest x-ray, CT head to rule out intracranial hemorrhage.  Patient was intubated for airway protection given he had an absent gag reflex.  Initiate IV fluid resuscitation.  Will place patient under IVC.   Clinical Course as of 04/20/22 0535  Thu Apr 20, 2022  0344 Have discussed case with CCU intensivist Ouma who will evaluate patient in the  emergency department for admission. [JS]  24 Of note, mother and fianc arrived at bedside and were updated of patient's status.  They both told me that this was an intentional overdose.  Patient has been drinking more and drink a lot over the weekend.  This evening he and his fiance got into an argument and he took a handful of a family member's Suboxone as well as 5-6 amitriptyline. [JS]    Clinical Course User Index [JS] Paulette Blanch, MD     FINAL CLINICAL IMPRESSION(S) / ED DIAGNOSES   Final diagnoses:  Intentional overdose, initial encounter [facility])  Suicide attempt ([place])  Acute respiratory failure, unspecified whether with hypoxia or hypercapnia ([place])     Rx / DC Orders   ED Discharge Orders     None        Note:  This document was prepared using Dragon voice recognition software and may include unintentional dictation errors.   Paulette Blanch, MD 04/20/22 [phone]

## 2022-04-20 NOTE — Progress Notes (Signed)
Initial Nutrition Assessment  DOCUMENTATION CODES:   Not applicable  INTERVENTION:   If patient does not extubate, recommend:   Vital 1.2@55ml /hr- Initiate at 37ml/hr and increase by 38ml/hr q 8 hours until goal rate is reached.  Free water flushes q4 hours to maintain tube patency   Regimen provides 1584kcal/day, 99g/day protein and 1674ml/day of free water.   Recommend thiamine and folic acid daily via tube   Pt at high refeed risk; recommend monitor potassium, magnesium and phosphorus labs daily until stable  NUTRITION DIAGNOSIS:   Inadequate oral intake related to inability to eat (pt sedated and ventilated) as evidenced by NPO status.  GOAL:   Provide needs based on ASPEN/SCCM guidelines  MONITOR:   Vent status, Labs, Weight trends, Skin, I & O's  REASON FOR ASSESSMENT:   Ventilator    ASSESSMENT:   26 y/o male with h/o substance abuse, PTSD, anxiety and depression who is admitted with intentional overdose and etoh intoxication.  Pt sedated and ventilated. Family at bedside reports pt with fair appetite and oral intake at baseline. Per chart, pt appears weight stable pta. Plan today if for possible extubation. Will plan to initiate tube feeds if pt does not extubate. Pt is at refeed risk.   Medications reviewed and include: lovenox, protonix  Labs reviewed: Na 147(H), K 4.1 wnl, BUN <5(L), P 4.0 wnl, Mg 2.1 wnl Cbgs- 119, 127, 118 x 24 hrs  Patient is currently intubated on ventilator support MV: 9.2 L/min Temp (24hrs), Avg:95.9 F (35.5 C), Min:95.1 F (35.1 C), Max:97.2 F (36.2 C)  Propofol: none   MAP- >47mmHg   UOP-   NUTRITION - FOCUSED PHYSICAL EXAM:  Flowsheet Row Most Recent Value  Orbital Region No depletion  Upper Arm Region Mild depletion  Thoracic and Lumbar Region No depletion  Buccal Region No depletion  Temple Region No depletion  Clavicle Bone Region No depletion  Clavicle and Acromion Bone Region No depletion   Scapular Bone Region No depletion  Dorsal Hand Mild depletion  Patellar Region Mild depletion  Anterior Thigh Region Mild depletion  Posterior Calf Region Mild depletion  Edema (RD Assessment) None  Hair Reviewed  Eyes Reviewed  Mouth Reviewed  Skin Reviewed  Nails Reviewed   Diet Order:   Diet Order             Diet NPO time specified  Diet effective now                  EDUCATION NEEDS:   No education needs have been identified at this time  Skin:  Skin Assessment: Reviewed RN Assessment (ecchymosis)  Last BM:  pta  Height:   Ht Readings from Last 1 Encounters:  04/20/22 5\' 4"  (1.626 m)    Weight:   Wt Readings from Last 1 Encounters:  04/20/22 58.1 kg    Ideal Body Weight:  59 kg  BMI:  Body mass index is 21.99 kg/m.  Estimated Nutritional Needs:   Kcal:  1533kcal/day  Protein:  90-100g/day  Fluid:  1.7-2.0L/day  04/22/22 MS, RD, LDN Please refer to [facility] for RD and/or RD on-call/weekend/after hours pager

## 2022-04-20 NOTE — Progress Notes (Signed)
eLink Physician-Brief Progress Note Patient Name: GAGAN DILLION DOB: [birth year] MRN: [id number]   Date of Service  04/20/2022  HPI/Events of Note  Patient admitted to the ICU following intubation for altered mental status and acute hypoxemic / hypercapnic respiratory failure in the context of an intentional overdose of Suboxone / Amitriptyline, and associated alcohol intoxication.  eICU Interventions  New Patient Evaluation.        Thomasene Lot Desirre Eickhoff 04/20/2022, 5:41 AM

## 2022-04-20 NOTE — Consult Note (Signed)
[facility] Face-to-Face Psychiatry Consult   Reason for Consult: Consult for 26 year old man brought to the hospital after overdose Referring Physician: Belia Heman Patient Identification: Seth Luna MRN:  [id number] Principal Diagnosis: Intentional drug overdose [facility]) Diagnosis:  Principal Problem:   Intentional drug overdose (HCC) Active Problems:   Acute respiratory failure (HCC)   Intentional overdose (HCC)   Moderate recurrent major depression (HCC)   Total Time spent with patient: 1 hour  Subjective:   Seth Luna is a 26 y.o. male patient admitted with "my girlfriend and I were fighting I guess".  HPI: Patient seen and chart reviewed.  26 year old man brought into the hospital by EMS after being found with altered mental status.  Overdose of amitriptyline and Suboxone possibly other medication.  Patient required brief intubation but is now off the ventilator.  He tells me that he and his girlfriend were fighting over something and he very impulsively swallowed pills.  He says at that time he did not have any conscious thought of wanting to die and that now he is very happy that he has not actually died.  He does however admit to chronic dysphoria and anxiety.  Feels like his life is difficult and not making any progress and that he has a lot of stress.  Not currently seeing anyone for mental health treatment and not on any psychiatric medicine.  Denies regular alcohol use but says he does use marijuana regularly.  Denies psychotic symptoms.  Patient lives with his fiance and his extended family.  Working currently.  Past Psychiatric History: Patient says he had some self-injury attempt years ago.  Has not had any inpatient psychiatric admissions.  Has some experience cutting years ago but never tried to kill himself.  Had briefly been on fluoxetine up to 40 mg a day but stopped it on his own because he did not think it was helpful.  Risk to Self:   Risk to Others:   Prior Inpatient  Therapy:   Prior Outpatient Therapy:    Past Medical History:  Past Medical History:  Diagnosis Date   ADHD (attention deficit hyperactivity disorder)    Substance abuse (HCC)     Past Surgical History:  Procedure Laterality Date   FOOT SURGERY     Family History: No family history on file. Family Psychiatric  History: Not aware of any Social History:  Social History   Substance and Sexual Activity  Alcohol Use Yes   Alcohol/week: 28.0 standard drinks of alcohol   Types: 28 Cans of beer per week     Social History   Substance and Sexual Activity  Drug Use Not Currently   Types: Marijuana, Methamphetamines    Social History   Socioeconomic History   Marital status: Single    Spouse name: Not on file   Number of children: Not on file   Years of education: Not on file   Highest education level: Not on file  Occupational History   Not on file  Tobacco Use   Smoking status: Some Days    Packs/day: 0.50    Types: Cigarettes   Smokeless tobacco: Never  Substance and Sexual Activity   Alcohol use: Yes    Alcohol/week: 28.0 standard drinks of alcohol    Types: 28 Cans of beer per week   Drug use: Not Currently    Types: Marijuana, Methamphetamines   Sexual activity: Not on file  Other Topics Concern   Not on file  Social History Narrative   Not  on file   Social Determinants of Health   Financial Resource Strain: Not on file  Food Insecurity: Not on file  Transportation Needs: Not on file  Physical Activity: Not on file  Stress: Not on file  Social Connections: Not on file   Additional Social History:    Allergies:   Allergies  Allergen Reactions   Haldol [Haloperidol Lactate] Other (See Comments)    Dystonic reaction    Labs:  Results for orders placed or performed during the hospital encounter of 04/20/22 (from the past 48 hour(s))  CBC with Differential     Status: Abnormal   Collection Time: 04/20/22  3:28 AM  Result Value Ref Range   WBC 6.5  4.0 - 10.5 K/uL   RBC 3.96 (L) 4.22 - 5.81 MIL/uL   Hemoglobin 11.6 (L) 13.0 - 17.0 g/dL   HCT 40.9 (L) 81.1 - 91.4 %   MCV 87.9 80.0 - 100.0 fL   MCH 29.3 26.0 - 34.0 pg   MCHC 33.3 30.0 - 36.0 g/dL   RDW 78.2 95.6 - 21.3 %   Platelets 222 150 - 400 K/uL   nRBC 0.0 0.0 - 0.2 %   Neutrophils Relative % 56 %   Neutro Abs 3.6 1.7 - 7.7 K/uL   Lymphocytes Relative 35 %   Lymphs Abs 2.3 0.7 - 4.0 K/uL   Monocytes Relative 6 %   Monocytes Absolute 0.4 0.1 - 1.0 K/uL   Eosinophils Relative 2 %   Eosinophils Absolute 0.1 0.0 - 0.5 K/uL   Basophils Relative 1 %   Basophils Absolute 0.0 0.0 - 0.1 K/uL   Immature Granulocytes 0 %   Abs Immature Granulocytes 0.02 0.00 - 0.07 K/uL    Comment: Performed at [facility], [address] [place], [place] [address]  Lactic acid, plasma     Status: None   Collection Time: 04/20/22  3:28 AM  Result Value Ref Range   Lactic Acid, Venous 1.2 0.5 - 1.9 mmol/L    Comment: Performed at [facility], [address] [place], [place] [address]  Comprehensive metabolic panel     Status: Abnormal   Collection Time: 04/20/22  3:28 AM  Result Value Ref Range   Sodium 146 (H) 135 - 145 mmol/L   Potassium 3.4 (L) 3.5 - 5.1 mmol/L   Chloride 112 (H) 98 - 111 mmol/L   CO2 30 22 - 32 mmol/L   Glucose, Bld 103 (H) 70 - 99 mg/dL    Comment: Glucose reference range applies only to samples taken after fasting for at least 8 hours.   BUN 6 6 - 20 mg/dL   Creatinine, Ser 2.95 0.61 - 1.24 mg/dL   Calcium 7.1 (L) 8.9 - 10.3 mg/dL   Total Protein 5.4 (L) 6.5 - 8.1 g/dL   Albumin 3.1 (L) 3.5 - 5.0 g/dL   AST 15 15 - 41 U/L   ALT 13 0 - 44 U/L   Alkaline Phosphatase 46 38 - 126 U/L   Total Bilirubin 0.2 (L) 0.3 - 1.2 mg/dL   GFR, Estimated >28 >41 mL/min    Comment: (NOTE) Calculated using the CKD-EPI Creatinine Equation (2021)    Anion gap 4 (L) 5 - 15    Comment: Performed at [facility], [address], [place], [place]  [address]  Troponin I (High Sensitivity)     Status: None   Collection Time: 04/20/22  3:28 AM  Result Value Ref Range   Troponin I (  High Sensitivity) <2 <18 ng/L    Comment: (NOTE) Elevated high sensitivity troponin I (hsTnI) values and significant  changes across serial measurements may suggest ACS but many other  chronic and acute conditions are known to elevate hsTnI results.  Refer to the "Links" section for chest pain algorithms and additional  guidance. Performed at [facility], [address] [place], [place] [address]   Acetaminophen level     Status: Abnormal   Collection Time: 04/20/22  3:28 AM  Result Value Ref Range   Acetaminophen (Tylenol), Serum <10 (L) 10 - 30 ug/mL    Comment: (NOTE) Therapeutic concentrations vary significantly. A range of 10-30 ug/mL  may be an effective concentration for many patients. However, some  are best treated at concentrations outside of this range. Acetaminophen concentrations >150 ug/mL at 4 hours after ingestion  and >50 ug/mL at 12 hours after ingestion are often associated with  toxic reactions.  Performed at [facility], [address] [place], [place] [address]   Salicylate level     Status: Abnormal   Collection Time: 04/20/22  3:28 AM  Result Value Ref Range   Salicylate Lvl <7.0 (L) 7.0 - 30.0 mg/dL    Comment: Performed at [facility], [address] [place], [place] [address]  Ethanol     Status: Abnormal   Collection Time: 04/20/22  3:28 AM  Result Value Ref Range   Alcohol, Ethyl (B) 185 (H) <10 mg/dL    Comment: (NOTE) Lowest detectable limit for serum alcohol is 10 mg/dL.  For medical purposes only. Performed at [facility], [address], [place], [place] [address]   Urine Drug Screen, Qualitative [facility] only)     Status: Abnormal   Collection Time: 04/20/22  4:14 AM  Result Value Ref Range   Tricyclic, Ur Screen POSITIVE (A) NONE DETECTED   Amphetamines, Ur  Screen NONE DETECTED NONE DETECTED   MDMA (Ecstasy)Ur Screen NONE DETECTED NONE DETECTED   Cocaine Metabolite,Ur [place] NONE DETECTED NONE DETECTED   Opiate, Ur Screen NONE DETECTED NONE DETECTED   Phencyclidine (PCP) Ur S NONE DETECTED NONE DETECTED   Cannabinoid 50 Ng, Ur  NONE DETECTED NONE DETECTED   Barbiturates, Ur Screen NONE DETECTED NONE DETECTED   Benzodiazepine, Ur Scrn POSITIVE (A) NONE DETECTED   Methadone Scn, Ur NONE DETECTED NONE DETECTED    Comment: (NOTE) Tricyclics + metabolites, urine    Cutoff 1000 ng/mL Amphetamines + metabolites, urine  Cutoff 1000 ng/mL MDMA (Ecstasy), urine              Cutoff 500 ng/mL Cocaine Metabolite, urine          Cutoff 300 ng/mL Opiate + metabolites, urine        Cutoff 300 ng/mL Phencyclidine (PCP), urine         Cutoff 25 ng/mL Cannabinoid, urine                 Cutoff 50 ng/mL Barbiturates + metabolites, urine  Cutoff 200 ng/mL Benzodiazepine, urine              Cutoff 200 ng/mL Methadone, urine                   Cutoff 300 ng/mL  The urine drug screen provides only a preliminary, unconfirmed analytical test result and should not be used for non-medical purposes. Clinical consideration and professional judgment should be applied to any positive drug screen result due to possible interfering substances.  A more specific alternate chemical method must be used in order to obtain a confirmed analytical result. Gas chromatography / mass spectrometry (GC/MS) is the preferred confirm atory method. Performed at [facility], [address] [place], [place] [address]   Urinalysis, Routine w reflex microscopic Urine, In & Out Cath     Status: Abnormal   Collection Time: 04/20/22  4:14 AM  Result Value Ref Range   Color, Urine STRAW (A) YELLOW   APPearance CLEAR (A) CLEAR   Specific Gravity, Urine 1.006 1.005 - 1.030   pH 7.0 5.0 - 8.0   Glucose, UA NEGATIVE NEGATIVE mg/dL   Hgb urine dipstick NEGATIVE NEGATIVE   Bilirubin  Urine NEGATIVE NEGATIVE   Ketones, ur NEGATIVE NEGATIVE mg/dL   Protein, ur NEGATIVE NEGATIVE mg/dL   Nitrite NEGATIVE NEGATIVE   Leukocytes,Ua NEGATIVE NEGATIVE    Comment: Performed at [facility], [address] [place], [place] [address]  Glucose, capillary     Status: Abnormal   Collection Time: 04/20/22  5:00 AM  Result Value Ref Range   Glucose-Capillary 118 (H) 70 - 99 mg/dL    Comment: Glucose reference range applies only to samples taken after fasting for at least 8 hours.  Blood gas, arterial     Status: Abnormal   Collection Time: 04/20/22  5:04 AM  Result Value Ref Range   FIO2 30 %   Delivery systems VENTILATOR    MECHVT 400 mL   PEEP 5 cm H20   pH, Arterial 7.28 (L) 7.35 - 7.45   pCO2 arterial 62 (H) 32 - 48 mmHg   pO2, Arterial 128 (H) 83 - 108 mmHg   Bicarbonate 29.1 (H) 20.0 - 28.0 mmol/L   Acid-Base Excess 0.8 0.0 - 2.0 mmol/L   O2 Saturation 98.9 %   Patient temperature 37.0    Collection site RIGHT RADIAL    Allens test (pass/fail) PASS PASS   Mechanical Rate 18     Comment: Performed at [facility], [address] [place], [place] [address]  MRSA Next Gen by PCR, Nasal     Status: None   Collection Time: 04/20/22  5:22 AM   Specimen: Nasal Mucosa; Nasal Swab  Result Value Ref Range   MRSA by PCR Next Gen NOT DETECTED NOT DETECTED    Comment: (NOTE) The GeneXpert MRSA Assay (FDA approved for NASAL specimens only), is one component of a comprehensive MRSA colonization surveillance program. It is not intended to diagnose MRSA infection nor to guide or monitor treatment for MRSA infections. Test performance is not FDA approved in patients less than 26 years old. Performed at [facility], [address] [place], [place] [address]   Lactic acid, plasma     Status: None   Collection Time: 04/20/22  5:58 AM  Result Value Ref Range   Lactic Acid, Venous 1.5 0.5 - 1.9 mmol/L    Comment: Performed at [facility],  [address] [place], [place] [address]  HIV Antibody (routine testing w rflx)     Status: None   Collection Time: 04/20/22  5:58 AM  Result Value Ref Range   HIV Screen 4th Generation wRfx Non Reactive Non Reactive    Comment: Performed at [facility], [address], [place], [place] [address]  CBC     Status: Abnormal   Collection Time: 04/20/22  5:58 AM  Result Value Ref Range   WBC 9.5 4.0 - 10.5 K/uL   RBC  4.32 4.22 - 5.81 MIL/uL   Hemoglobin 12.8 (L) 13.0 - 17.0 g/dL   HCT 16.138.0 (L) 09.639.0 - 04.552.0 %   MCV 88.0 80.0 - 100.0 fL   MCH 29.6 26.0 - 34.0 pg   MCHC 33.7 30.0 - 36.0 g/dL   RDW 40.913.2 81.111.5 - 91.415.5 %   Platelets 231 150 - 400 K/uL   nRBC 0.0 0.0 - 0.2 %    Comment: Performed at [facility], [address] [place], [place] [address]  Basic metabolic panel     Status: Abnormal   Collection Time: 04/20/22  5:58 AM  Result Value Ref Range   Sodium 147 (H) 135 - 145 mmol/L   Potassium 4.1 3.5 - 5.1 mmol/L   Chloride 114 (H) 98 - 111 mmol/L   CO2 28 22 - 32 mmol/L   Glucose, Bld 108 (H) 70 - 99 mg/dL    Comment: Glucose reference range applies only to samples taken after fasting for at least 8 hours.   BUN <5 (L) 6 - 20 mg/dL   Creatinine, Ser 6.210.73 0.61 - 1.24 mg/dL   Calcium 7.1 (L) 8.9 - 10.3 mg/dL   GFR, Estimated >30>60 >86>60 mL/min    Comment: (NOTE) Calculated using the CKD-EPI Creatinine Equation (2021)    Anion gap 5 5 - 15    Comment: Performed at [facility], [address] [place], [place] [address]  Magnesium     Status: None   Collection Time: 04/20/22  5:58 AM  Result Value Ref Range   Magnesium 2.1 1.7 - 2.4 mg/dL    Comment: Performed at [facility], [address] [place], [place] [address]  Phosphorus     Status: None   Collection Time: 04/20/22  5:58 AM  Result Value Ref Range   Phosphorus 4.0 2.5 - 4.6 mg/dL    Comment: Performed at [facility], [address] [place], [place] [address]   Troponin I (High Sensitivity)     Status: None   Collection Time: 04/20/22  5:58 AM  Result Value Ref Range   Troponin I (High Sensitivity) 3 <18 ng/L    Comment: (NOTE) Elevated high sensitivity troponin I (hsTnI) values and significant  changes across serial measurements may suggest ACS but many other  chronic and acute conditions are known to elevate hsTnI results.  Refer to the "Links" section for chest pain algorithms and additional  guidance. Performed at [facility], [address] [place], [place] [address]   Acetaminophen level     Status: Abnormal   Collection Time: 04/20/22  7:42 AM  Result Value Ref Range   Acetaminophen (Tylenol), Serum <10 (L) 10 - 30 ug/mL    Comment: (NOTE) Therapeutic concentrations vary significantly. A range of 10-30 ug/mL  may be an effective concentration for many patients. However, some  are best treated at concentrations outside of this range. Acetaminophen concentrations >150 ug/mL at 4 hours after ingestion  and >50 ug/mL at 12 hours after ingestion are often associated with  toxic reactions.  Performed at [facility], [address] [place], [place] [address]   Glucose, capillary     Status: Abnormal   Collection Time: 04/20/22  7:46 AM  Result Value Ref Range   Glucose-Capillary 127 (H) 70 - 99 mg/dL    Comment: Glucose reference range applies only to samples taken after fasting for at least 8 hours.  Glucose, capillary     Status: Abnormal   Collection  Time: 04/20/22 11:54 AM  Result Value Ref Range   Glucose-Capillary 119 (H) 70 - 99 mg/dL    Comment: Glucose reference range applies only to samples taken after fasting for at least 8 hours.    Current Facility-Administered Medications  Medication Dose Route Frequency Provider Last Rate Last Admin   Chlorhexidine Gluconate Cloth 2 % PADS 6 each  6 each Topical Q0600 Erin Fulling, MD   6 each at 04/20/22 0515   docusate (COLACE) 50 MG/5ML liquid 100 mg   100 mg Per Tube BID PRN Jimmye Norman, NP       enoxaparin (LOVENOX) injection 40 mg  40 mg Subcutaneous Q24H Jimmye Norman, NP   40 mg at 04/20/22 0800   [START ON 04/21/2022] escitalopram (LEXAPRO) tablet 5 mg  5 mg Oral Daily Jovonni Borquez T, MD       pantoprazole sodium (PROTONIX) 40 mg/20 mL oral suspension 40 mg  40 mg Per Tube Daily Ouma, Hubbard Hartshorn, NP       polyethylene glycol (MIRALAX / GLYCOLAX) packet 17 g  17 g Per Tube Daily PRN Ouma, Hubbard Hartshorn, NP        Musculoskeletal: Strength & Muscle Tone: within normal limits Gait & Station: unable to stand Patient leans: N/A            Psychiatric Specialty Exam:  Presentation  General Appearance: No data recorded Eye Contact:No data recorded Speech:No data recorded Speech Volume:No data recorded Handedness:No data recorded  Mood and Affect  Mood:No data recorded Affect:No data recorded  Thought Process  Thought Processes:No data recorded Descriptions of Associations:No data recorded Orientation:No data recorded Thought Content:No data recorded History of Schizophrenia/Schizoaffective disorder:No data recorded Duration of Psychotic Symptoms:No data recorded Hallucinations:No data recorded Ideas of Reference:No data recorded Suicidal Thoughts:No data recorded Homicidal Thoughts:No data recorded  Sensorium  Memory:No data recorded Judgment:No data recorded Insight:No data recorded  Executive Functions  Concentration:No data recorded Attention Span:No data recorded Recall:No data recorded Fund of Knowledge:No data recorded Language:No data recorded  Psychomotor Activity  Psychomotor Activity:No data recorded  Assets  Assets:No data recorded  Sleep  Sleep:No data recorded  Physical Exam: Physical Exam Vitals and nursing note reviewed.  Constitutional:      Appearance: Normal appearance.  HENT:     Head: Normocephalic and atraumatic.     Mouth/Throat:      Pharynx: Oropharynx is clear.  Eyes:     Pupils: Pupils are equal, round, and reactive to light.  Cardiovascular:     Rate and Rhythm: Normal rate and regular rhythm.  Pulmonary:     Effort: Pulmonary effort is normal.     Breath sounds: Normal breath sounds.  Abdominal:     General: Abdomen is flat.     Palpations: Abdomen is soft.  Musculoskeletal:        General: Normal range of motion.  Skin:    General: Skin is warm and dry.  Neurological:     General: No focal deficit present.     Mental Status: He is alert. Mental status is at baseline.  Psychiatric:        Attention and Perception: He is inattentive.        Mood and Affect: Mood normal. Affect is blunt.        Speech: Speech is delayed.        Behavior: Behavior is withdrawn.        Thought Content: Thought content normal. Thought content does not include suicidal  ideation.    Review of Systems  Constitutional: Negative.   HENT: Negative.    Eyes: Negative.   Respiratory: Negative.    Cardiovascular: Negative.   Gastrointestinal: Negative.   Musculoskeletal: Negative.   Skin: Negative.   Neurological: Negative.   Psychiatric/Behavioral:  Positive for depression and memory loss. Negative for hallucinations, substance abuse and suicidal ideas. The patient is nervous/anxious.    Blood pressure (!) 128/91, pulse (!) 105, temperature 98.2 F (36.8 C), resp. rate 13, height 5\' 4"  (1.626 m), weight 58.1 kg, SpO2 97 %. Body mass index is 21.99 kg/m.  Treatment Plan Summary: Medication management and Plan 26 year old man took an impulsive overdose of medication and not belonging to him.  Denies that he had any wish to die.  States now that he is very glad he did not succeed and feels guilty about having tried.  Patient describes chronic social anxiety and depression.  No psychosis.  At this point I think he can be taken off of IVC as he is cooperative with treatment with no current suicidal thought.  I suggested to him  restarting medication for depression and anxiety with Lexapro.  He was agreeable to that.  Orders placed for 5 mg a day.  Patient does not think inpatient treatment would be necessary but we will follow up again tomorrow  Disposition: No evidence of imminent risk to self or others at present.   Supportive therapy provided about ongoing stressors. See note.  He should be referred for outpatient treatment at discharge  30, MD 04/20/2022 3:46 PM

## 2022-04-20 NOTE — ED Triage Notes (Addendum)
Pt presents via EMS for drug overdose per family. Pt OD on suboxone and amitriptyline. Pt received 8mg  of Narcan (4mg  intranasal & 4mg  IV) PTA with no improvement. Pt also received 1 amp of sodium bicarb.    CBG 128

## 2022-04-21 ENCOUNTER — Other Ambulatory Visit: Payer: Self-pay

## 2022-04-21 LAB — CBC WITH DIFFERENTIAL/PLATELET
Abs Immature Granulocytes: 0.03 10*3/uL (ref 0.00–0.07)
Basophils Absolute: 0 10*3/uL (ref 0.0–0.1)
Basophils Relative: 0 %
Eosinophils Absolute: 0.2 10*3/uL (ref 0.0–0.5)
Eosinophils Relative: 2 %
HCT: 36 % — ABNORMAL LOW (ref 39.0–52.0)
Hemoglobin: 12 g/dL — ABNORMAL LOW (ref 13.0–17.0)
Immature Granulocytes: 0 %
Lymphocytes Relative: 24 %
Lymphs Abs: 2.3 10*3/uL (ref 0.7–4.0)
MCH: 29.7 pg (ref 26.0–34.0)
MCHC: 33.3 g/dL (ref 30.0–36.0)
MCV: 89.1 fL (ref 80.0–100.0)
Monocytes Absolute: 1 10*3/uL (ref 0.1–1.0)
Monocytes Relative: 10 %
Neutro Abs: 6.1 10*3/uL (ref 1.7–7.7)
Neutrophils Relative %: 64 %
Platelets: 215 10*3/uL (ref 150–400)
RBC: 4.04 MIL/uL — ABNORMAL LOW (ref 4.22–5.81)
RDW: 13.4 % (ref 11.5–15.5)
WBC: 9.7 10*3/uL (ref 4.0–10.5)
nRBC: 0 % (ref 0.0–0.2)

## 2022-04-21 LAB — GLUCOSE, CAPILLARY
Glucose-Capillary: 92 mg/dL (ref 70–99)
Glucose-Capillary: 115 mg/dL — ABNORMAL HIGH (ref 70–99)

## 2022-04-21 LAB — BASIC METABOLIC PANEL
Anion gap: 8 (ref 5–15)
BUN: 7 mg/dL (ref 6–20)
CO2: 30 mmol/L (ref 22–32)
Calcium: 8.2 mg/dL — ABNORMAL LOW (ref 8.9–10.3)
Chloride: 103 mmol/L (ref 98–111)
Creatinine, Ser: 0.76 mg/dL (ref 0.61–1.24)
GFR, Estimated: >60 mL/min (ref >60)
Glucose, Bld: 97 mg/dL (ref 70–99)
Potassium: 3.4 mmol/L — ABNORMAL LOW (ref 3.5–5.1)
Sodium: 141 mmol/L (ref 135–145)

## 2022-04-21 LAB — PHOSPHORUS: Phosphorus: 5 mg/dL — ABNORMAL HIGH (ref 2.5–4.6)

## 2022-04-21 LAB — MAGNESIUM: Magnesium: 2.1 mg/dL (ref 1.7–2.4)

## 2022-04-21 MED ORDER — ESCITALOPRAM OXALATE 5 MG PO TABS
5.0000 mg | ORAL_TABLET | Freq: Every day | ORAL | 0 refills | Status: DC
  Filled 2022-04-21: qty 30, 30d supply, fill #0

## 2022-04-21 MED ORDER — TRAZODONE HCL 100 MG PO TABS: 100.0000 mg | ORAL_TABLET | Freq: Every evening | ORAL | 1 refills | Status: DC | PRN

## 2022-04-21 MED ORDER — MELATONIN 3 MG PO CAPS
3.0000 mg | ORAL_CAPSULE | Freq: Every evening | ORAL | 0 refills | Status: DC | PRN
  Filled 2022-04-21: qty 20, 20d supply, fill #0

## 2022-04-21 MED ORDER — POTASSIUM CHLORIDE CRYS ER 20 MEQ PO TBCR
40.0000 meq | EXTENDED_RELEASE_TABLET | Freq: Once | ORAL | Status: AC
Start: 2022-04-21 — End: 2022-04-21
  Administered 2022-04-21: 40 meq via ORAL
  Filled 2022-04-21: qty 2

## 2022-04-21 MED ORDER — POTASSIUM CHLORIDE 20 MEQ PO PACK: 40.0000 meq | PACK | Freq: Once | ORAL | Status: DC

## 2022-04-21 MED ORDER — IBUPROFEN 400 MG PO TABS
600.0000 mg | ORAL_TABLET | Freq: Four times a day (QID) | ORAL | Status: DC | PRN
  Administered 2022-04-21: 600 mg via ORAL
  Filled 2022-04-21: qty 2

## 2022-04-21 MED ORDER — ADULT MULTIVITAMIN W/MINERALS CH
1.0000 | ORAL_TABLET | Freq: Every day | ORAL | Status: DC
  Administered 2022-04-21: 1 via ORAL
  Filled 2022-04-21: qty 1

## 2022-04-21 MED ORDER — POTASSIUM CHLORIDE 20 MEQ PO PACK
40.0000 meq | PACK | Freq: Once | ORAL | Status: AC
  Administered 2022-04-21: 40 meq via ORAL
  Filled 2022-04-21: qty 2

## 2022-04-21 MED ORDER — PANTOPRAZOLE SODIUM 40 MG PO TBEC
40.0000 mg | DELAYED_RELEASE_TABLET | Freq: Every day | ORAL | Status: DC
  Administered 2022-04-21: 40 mg via ORAL
  Filled 2022-04-21: qty 1

## 2022-04-21 NOTE — Consult Note (Signed)
Psychiatry: Follow-up for this young man who was brought into the hospital after an impulsive but intentional overdose of medication.  Patient is medically cleared and is being prepared for discharge.  Patient wanted to request other medicine to help for sleep.  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  Melatonin has proved ineffective.  Patient is still a little blunted and slow and appears anxious but denies any suicidal ideation.  He is present with his partner who agrees with the plan for discharge.  Patient is encouraged to be following up with [facility] after discharge.  I discussed sleep options with him and offered to add a prescription for trazodone 100 mg at night as needed for sleep with the option to take 50 if that is all he required.  Patient was agreeable.  I have put that into his local pharmacy.

## 2022-04-21 NOTE — Care Management (Signed)
Patient has no PCP and no insurance.  Application for [facility] provided, along with clinic contact information.  Patient has support of family at home.

## 2022-04-21 NOTE — Discharge Summary (Signed)
Physician Discharge Summary   Patient: Seth Luna MRN: [id number] DOB: 1996/10/10  Admit date:     04/20/2022  Discharge date: 04/21/22  Discharge Physician: Marrion Coy   PCP: Patient, No Pcp Per   Recommendations at discharge:   Follow-up with PCP in 1 to 2 weeks.  I have asked social worker to set it up.  Discharge Diagnoses: Principal Problem:   Intentional drug overdose (HCC) Active Problems:   Acute respiratory failure (HCC)   Intentional overdose (HCC)   Moderate recurrent major depression (HCC)  Resolved Problems:   * No resolved hospital problems. *  Hospital Course: 26 y.o male with significant PMH of ADHD, anxiety and depression, Bipolar Disorder, and polysubstance abuse who presented to the ED with chief complaints of drug overdose and alcohol intoxication. Patient was emergently intubated for airway protection as patient had developed significant metabolic encephalopathy, acute respiratory failure.  Patient was also given Narcan for intoxication with the Suboxone.  By afternoon yesterday, patient condition had improved.  He was extubated.  He was seen by psychiatry, started on lower dose antidepressants. Upon my examination today, patient condition had improved, currently he is alert and oriented x4.  No respite distress.  Currently medically stable to be discharged.  Assessment and Plan: Acute hypoxemic respiratory failure secondary to intentional overdose and alcohol intoxication. Overdose with amitriptyline and Suboxone plus alcohol. Toxic-metabolic encephalopathy secondary to Intentional Drug Overdose and EtOH intoxication Depression and anxiety Patient was extubated yesterday, no event overnight.  Currently patient does not require any oxygen, no short of breath.  Alert and oriented x4. Patient has has been seen by psychiatry, started on lower dose antidepressants.  Patient is deemed no longer a risk to himself, I spoke with Dr. Toni Amend, patient is a stable  from a pain standpoint to discharge.  I will also ask social worker to help with PCP set up.  Hypernatremia. Hypokalemia. Sodium level has normalized, potassium 3.4, give oral potassium before discharge      Consultants: ICU, Psychiatry Procedures performed: Mechanical ventilation  Disposition: Home Diet recommendation:  Discharge Diet Orders (From admission, onward)     Start     Ordered   04/21/22 0000  Diet - low sodium heart healthy        04/21/22 1008           Cardiac diet DISCHARGE MEDICATION: Allergies as of 04/21/2022       Reactions   Haldol [haloperidol Lactate] Other (See Comments)   Dystonic reaction        Medication List     STOP taking these medications    amoxicillin 500 MG capsule Commonly known as: AMOXIL   buPROPion 150 MG 12 hr tablet Commonly known as: WELLBUTRIN SR   FLUoxetine 40 MG capsule Commonly known as: PROZAC   ibuprofen 600 MG tablet Commonly known as: ADVIL   mirtazapine 15 MG tablet Commonly known as: REMERON       TAKE these medications    escitalopram 5 MG tablet Commonly known as: LEXAPRO Take 1 tablet (5 mg total) by mouth daily.        Discharge Exam: Filed Weights   04/20/22 0335 04/20/22 0515 04/21/22 0539  Weight: 63 kg 58.1 kg 59 kg   General exam: Appears calm and comfortable  Respiratory system: Clear to auscultation. Respiratory effort normal. Cardiovascular system: S1 & S2 heard, RRR. No JVD, murmurs, rubs, gallops or clicks. No pedal edema. Gastrointestinal system: Abdomen is nondistended, soft and nontender. No organomegaly  or masses felt. Normal bowel sounds heard. Central nervous system: Alert and oriented. No focal neurological deficits. Extremities: Symmetric 5 x 5 power. Skin: No rashes, lesions or ulcers Psychiatry: Judgement and insight appear normal. Mood & affect appropriate.    Condition at discharge: good  The results of significant diagnostics from this hospitalization  (including imaging, microbiology, ancillary and laboratory) are listed below for reference.   Imaging Studies: DG Chest Port 1 View  Result Date: 04/20/2022 CLINICAL DATA:  26 year old male central line placement. EXAM: PORTABLE CHEST 1 VIEW COMPARISON:  Portable chest 0342 hours today. FINDINGS: Portable AP upright view at 0705 hours. Right IJ approach central line placed with tip at the level of the carina, lower SVC. Endotracheal tube and enteric tube appear stable and satisfactory. Larger lung volumes. Allowing for portable technique the lungs are clear. No pneumothorax. Normal cardiac size and mediastinal contours. No acute osseous abnormality identified. IMPRESSION: 1. Right IJ central line placed with tip at the lower SVC level. No pneumothorax. 2. Otherwise stable lines and tubes. No cardiopulmonary abnormality. Electronically Signed   By: Odessa Fleming M.D.   On: 04/20/2022 07:19   CT HEAD WO CONTRAST ( )  Result Date: 04/20/2022 CLINICAL DATA:  Altered mental status, drug overdose EXAM: CT HEAD WITHOUT CONTRAST TECHNIQUE: Contiguous axial images were obtained from the base of the skull through the vertex without intravenous contrast. RADIATION DOSE REDUCTION: This exam was performed according to the departmental dose-optimization program which includes automated exposure control, adjustment of the mA and/or kV according to patient size and/or use of iterative reconstruction technique. COMPARISON:  None Available. FINDINGS: Brain: Normal anatomic configuration. No abnormal intra or extra-axial mass lesion or fluid collection. No abnormal mass effect or midline shift. No evidence of acute intracranial hemorrhage or infarct. Ventricular size is normal. Cerebellum unremarkable. Vascular: Unremarkable Skull: Intact Sinuses/Orbits: Paranasal sinuses are clear. Orbits are unremarkable. Other: Mastoid air cells and middle ear cavities are clear. IMPRESSION: No acute intracranial abnormality. Electronically  Signed   By: Helyn Numbers M.D.   On: 04/20/2022 04:06   DG Chest Portable 1 View  Result Date: 04/20/2022 CLINICAL DATA:  Intubation and OG tube placement post overdose. EXAM: PORTABLE CHEST 1 VIEW COMPARISON:  05/28/2020 FINDINGS: Endotracheal tube placed with tip measuring 1.8 cm above the carina. Enteric tube placed with tip in the left upper quadrant consistent with location in the upper stomach. Shallow inspiration. Heart size and pulmonary vascularity are normal. Lungs are clear. No pleural effusions. No pneumothorax. Mediastinal contours appear intact. IMPRESSION: Appliances appear in satisfactory position. Shallow inspiration. No evidence of active pulmonary disease. Electronically Signed   By: Burman Nieves M.D.   On: 04/20/2022 03:54    Microbiology: Results for orders placed or performed during the hospital encounter of 04/20/22  MRSA Next Gen by PCR, Nasal     Status: None   Collection Time: 04/20/22  5:22 AM   Specimen: Nasal Mucosa; Nasal Swab  Result Value Ref Range Status   MRSA by PCR Next Gen NOT DETECTED NOT DETECTED Final    Comment: (NOTE) The GeneXpert MRSA Assay (FDA approved for NASAL specimens only), is one component of a comprehensive MRSA colonization surveillance program. It is not intended to diagnose MRSA infection nor to guide or monitor treatment for MRSA infections. Test performance is not FDA approved in patients less than 58 years old. Performed at [facility], [address] [place], [place] [address]     Labs: CBC: Recent Labs  Lab  04/20/22 0328 04/20/22 0558 04/21/22 0451  WBC 6.5 9.5 9.7  NEUTROABS 3.6  --  6.1  HGB 11.6* 12.8* 12.0*  HCT 34.8* 38.0* 36.0*  MCV 87.9 88.0 89.1  PLT 222 231 215   Basic Metabolic Panel: Recent Labs  Lab 04/20/22 0328 04/20/22 0558 04/21/22 0451  NA 146* 147* 141  K 3.4* 4.1 3.4*  CL 112* 114* 103  CO2 30 28 30   GLUCOSE 103* 108* 97  BUN 6 <5* 7  CREATININE 0.75 0.73 0.76  CALCIUM  7.1* 7.1* 8.2*  MG  --  2.1 2.1  PHOS  --  4.0 5.0*   Liver Function Tests: Recent Labs  Lab 04/20/22 0328  AST 15  ALT 13  ALKPHOS 46  BILITOT 0.2*  PROT 5.4*  ALBUMIN 3.1*   CBG: Recent Labs  Lab 04/20/22 1154 04/20/22 1626 04/20/22 2119 04/21/22 0538 04/21/22 0906  GLUCAP 119* 93 99 92 115*    Discharge time spent: greater than 30 minutes.  Signed: 04/23/22, MD [facility] 04/21/2022

## 2022-04-21 NOTE — Progress Notes (Addendum)
Nutrition Follow-up  DOCUMENTATION CODES:   Not applicable  INTERVENTION:   -MVI with minerals daily -Magic cup TID with meals, each supplement provides 290 kcal and 9 grams of protein   NUTRITION DIAGNOSIS:   Inadequate oral intake related to inability to eat (pt sedated and ventilated) as evidenced by NPO status.  Progressing; advanced to PO diet on 04/20/22  GOAL:   Patient will meet greater than or equal to 90% of their needs  Progressing   MONITOR:   PO intake, Supplement acceptance  REASON FOR ASSESSMENT:   Ventilator    ASSESSMENT:   26 y/o male with h/o substance abuse, PTSD, anxiety and depression who is admitted with intentional overdose and etoh intoxication.  6/29- extubated, advanced to regular diet  Reviewed I/O's: +3.8 L x 24 hours and +3 L since admission  Pt unavailable at time of visit. Attempted to speak with pt via call to hospital room phone, however, unable to reach.   Pt extubated yesterday and advanced to a regular diet. Noted meal completions 50%.   Per psychiatry notes, pt with no imminent risk for self for others; plan to refer to outpatient treatment at discharge.   Medications reviewed.   Labs reviewed: K: 3.4, CBGS: 92-119.   Diet Order:   Diet Order             Diet regular Room service appropriate? Yes; Fluid consistency: Thin  Diet effective now                   EDUCATION NEEDS:   No education needs have been identified at this time  Skin:  Skin Assessment: Reviewed RN Assessment  Last BM:  Unknown  Height:   Ht Readings from Last 1 Encounters:  04/20/22 5\' 4"  (1.626 m)    Weight:   Wt Readings from Last 1 Encounters:  04/21/22 59 kg    Ideal Body Weight:  59 kg  BMI:  Body mass index is 22.33 kg/m.  Estimated Nutritional Needs:   Kcal:  1750-1950  Protein:  85-100 grams  Fluid:  > 1.7 L    04/23/22, RD, LDN, CDCES Registered Dietitian II Certified Diabetes Care and Education  Specialist Please refer to [facility] for RD and/or RD on-call/weekend/after hours pager

## 2022-06-16 ENCOUNTER — Other Ambulatory Visit: Payer: Self-pay

## 2022-07-03 ENCOUNTER — Ambulatory Visit: Payer: Self-pay | Admitting: Family Medicine

## 2022-07-10 ENCOUNTER — Encounter: Payer: Self-pay | Admitting: General Practice

## 2022-09-06 ENCOUNTER — Ambulatory Visit: Payer: Self-pay

## 2023-03-09 ENCOUNTER — Other Ambulatory Visit: Payer: Self-pay

## 2024-01-04 ENCOUNTER — Emergency Department: Payer: MEDICAID

## 2024-01-04 ENCOUNTER — Emergency Department
Admission: EM | Admit: 2024-01-04 | Discharge: 2024-01-04 | Disposition: A | Discharge status: Home / Self Care | Payer: MEDICAID | Attending: Emergency Medicine | Admitting: Emergency Medicine

## 2024-01-04 ENCOUNTER — Other Ambulatory Visit: Payer: Self-pay

## 2024-01-04 DIAGNOSIS — X58XXXA Exposure to other specified factors, initial encounter: Secondary | ICD-10-CM | POA: Insufficient documentation

## 2024-01-04 DIAGNOSIS — T402X1A Poisoning by other opioids, accidental (unintentional), initial encounter: Secondary | ICD-10-CM | POA: Insufficient documentation

## 2024-01-04 DIAGNOSIS — Y903 Blood alcohol level of 60-79 mg/100 ml: Secondary | ICD-10-CM | POA: Insufficient documentation

## 2024-01-04 DIAGNOSIS — T50901A Poisoning by unspecified drugs, medicaments and biological substances, accidental (unintentional), initial encounter: Secondary | ICD-10-CM

## 2024-01-04 LAB — COMPREHENSIVE METABOLIC PANEL
ALT: 18 U/L (ref 0–44)
AST: 19 U/L (ref 15–41)
Albumin: 4.2 g/dL (ref 3.5–5.0)
Alkaline Phosphatase: 49 U/L (ref 38–126)
Anion gap: 12 (ref 5–15)
BUN: 6 mg/dL (ref 6–20)
CO2: 24 mmol/L (ref 22–32)
Calcium: 9.2 mg/dL (ref 8.9–10.3)
Chloride: 102 mmol/L (ref 98–111)
Creatinine, Ser: 0.87 mg/dL (ref 0.61–1.24)
GFR, Estimated: >60 mL/min (ref >60)
Glucose, Bld: 109 mg/dL — ABNORMAL HIGH (ref 70–99)
Potassium: 3 mmol/L — ABNORMAL LOW (ref 3.5–5.1)
Sodium: 138 mmol/L (ref 135–145)
Total Bilirubin: 0.5 mg/dL (ref 0.0–1.2)
Total Protein: 7.6 g/dL (ref 6.5–8.1)

## 2024-01-04 LAB — URINE DRUG SCREEN, QUALITATIVE (ARMC ONLY)
Amphetamines, Ur Screen: POSITIVE — AB
Barbiturates, Ur Screen: NOT DETECTED
Benzodiazepine, Ur Scrn: NOT DETECTED
Cannabinoid 50 Ng, Ur ~~LOC~~: POSITIVE — AB
Cocaine Metabolite,Ur ~~LOC~~: POSITIVE — AB
MDMA (Ecstasy)Ur Screen: NOT DETECTED
Methadone Scn, Ur: NOT DETECTED
Opiate, Ur Screen: NOT DETECTED
Phencyclidine (PCP) Ur S: NOT DETECTED
Tricyclic, Ur Screen: NOT DETECTED

## 2024-01-04 LAB — CBC
HCT: 45.7 % (ref 39.0–52.0)
Hemoglobin: 15.9 g/dL (ref 13.0–17.0)
MCH: 30.3 pg (ref 26.0–34.0)
MCHC: 34.8 g/dL (ref 30.0–36.0)
MCV: 87 fL (ref 80.0–100.0)
Platelets: 284 10*3/uL (ref 150–400)
RBC: 5.25 MIL/uL (ref 4.22–5.81)
RDW: 12.9 % (ref 11.5–15.5)
WBC: 6.1 10*3/uL (ref 4.0–10.5)
nRBC: 0 % (ref 0.0–0.2)

## 2024-01-04 LAB — ETHANOL: Alcohol, Ethyl (B): 72 mg/dL — ABNORMAL HIGH (ref <10)

## 2024-01-04 LAB — ACETAMINOPHEN LEVEL: Acetaminophen (Tylenol), Serum: <10 ug/mL — ABNORMAL LOW (ref 10–30)

## 2024-01-04 LAB — SALICYLATE LEVEL: Salicylate Lvl: <7 mg/dL — ABNORMAL LOW (ref 7.0–30.0)

## 2024-01-04 MED ORDER — SODIUM CHLORIDE 0.9 % IV BOLUS
1000.0000 mL | Freq: Once | INTRAVENOUS | Status: AC
  Administered 2024-01-04: 1000 mL via INTRAVENOUS

## 2024-01-04 MED ORDER — ONDANSETRON HCL 4 MG/2ML IJ SOLN
4.0000 mg | Freq: Once | INTRAMUSCULAR | Status: AC
  Administered 2024-01-04: 4 mg via INTRAVENOUS
  Filled 2024-01-04: qty 2

## 2024-01-04 MED ORDER — ACETAMINOPHEN 325 MG PO TABS
650.0000 mg | ORAL_TABLET | Freq: Once | ORAL | Status: AC
  Administered 2024-01-04: 650 mg via ORAL
  Filled 2024-01-04: qty 2

## 2024-01-04 NOTE — ED Provider Notes (Signed)
 [facility] Provider Note    Event Date/Time   First MD Initiated Contact with Patient 01/04/24 0134     (approximate)  History   Chief Complaint: Drug Overdose  HPI  Seth Luna is a 28 y.o. male with a past medical history of substance abuse, ADHD, presents to the emergency department after an accidental drug overdose.  According to the patient he was using a substance tonight that he believes was fentanyl.  Patient states he thinks he smoked it but does not really recall.  Patient denies any history of IV drug use.  Patient does have a history of substance abuse as well as an overdose previously and they keep Narcan in the house.  Per report parents found the patient unresponsive and administered 12 mg of intranasal Narcan.  Upon arrival to the emergency department patient is awake alert oriented.  Patient does not know what time this occurred at.  States he has been nauseated with several episodes of vomiting.  Patient is answering all questions appropriately.  Physical Exam   Triage Vital Signs: ED Triage Vitals  Encounter Vitals Group     BP 01/04/24 0134 (!) 167/127     Systolic BP Percentile --      Diastolic BP Percentile --      Pulse Rate 01/04/24 0134 (!) 110     Resp 01/04/24 0134 20     Temp 01/04/24 0134 98.5 F (36.9 C)     Temp Source 01/04/24 0134 Oral     SpO2 01/04/24 0134 100 %     Weight 01/04/24 0135 130 lb 1.1 oz (59 kg)     Height 01/04/24 0135 5\' 4"  (1.626 m)     Head Circumference --      Peak Flow --      Pain Score 01/04/24 0135 0     Pain Loc --      Pain Education --      Exclude from Growth Chart --     Most recent vital signs: Vitals:   01/04/24 0134  BP: (!) 167/127  Pulse: (!) 110  Resp: 20  Temp: 98.5 F (36.9 C)  SpO2: 100%    General: Awake, no distress.  3-4 mm pupils bilaterally. CV:  Good peripheral perfusion.  Regular rate and rhythm  Resp:  Normal effort.  Equal breath sounds bilaterally.   Abd:  No distention.  Soft  ED Results / Procedures / Treatments   EKG  EKG viewed and interpreted by myself shows sinus tachycardia 104 bpm with a narrow QRS, normal axis, normal intervals, no concerning ST changes.  RADIOLOGY  I have reviewed and interpreted chest x-ray images.  No consolidation on my evaluation. Radiology has read the x-ray as negative   MEDICATIONS ORDERED IN ED: Medications  sodium chloride 0.9 % bolus 1,000 mL (has no administration in time range)  ondansetron (ZOFRAN) injection 4 mg (has no administration in time range)     IMPRESSION / MDM / ASSESSMENT AND PLAN / ED COURSE  I reviewed the triage vital signs and the nursing notes.  Patient's presentation is most consistent with acute presentation with potential threat to life or bodily function.  Patient presents the emergency department for accidental opioid overdose.  Patient is currently awake alert no distress.  Does state continued nausea.  We will dose nausea medication, IV fluids.  Will check labs and continue to closely monitor on telemetry.  Awaiting parents arrival.  Given the patient's unknown  unresponsive time we will obtain a chest x-ray to rule out aspiration currently satting 100% on room air.  We will monitor for several hours in the emergency department.  Patient is workup is reassuring, CBC is normal, chemistry is normal, acetaminophen and salicylate are negative alcohol level slightly elevated at 72.  Chest x-ray is clear.  Patient has been in the emergency department now 2-1/2 hours.  Patient's mother is here with the patient.  They are both asking to be discharged so they can leave.  Patient states he is hungry and wants to get something to eat.  Patient remains awake alert no distress.  Provided with resources.  Discharged home.  FINAL CLINICAL IMPRESSION(S) / ED DIAGNOSES   Accidental opioid overdose    Note:  This document was prepared using Dragon voice recognition software and  may include unintentional dictation errors.   Minna Antis, MD 01/04/24 [phone]

## 2024-01-04 NOTE — ED Triage Notes (Signed)
 Patient brought in by ems from home, fentanyl overdose, family stated patient stopped breathing, parents gave 12mg  of narcan, patient alert and oriented during triage
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
## 2024-08-12 ENCOUNTER — Other Ambulatory Visit: Payer: Self-pay

## 2024-08-12 ENCOUNTER — Emergency Department: Payer: MEDICAID

## 2024-08-12 ENCOUNTER — Encounter (HOSPITAL_COMMUNITY): Payer: Self-pay

## 2024-08-12 ENCOUNTER — Emergency Department
Admission: EM | Admit: 2024-08-12 | Discharge: 2024-08-13 | Disposition: A | Discharge status: Other Acute Inpatient Hospital | Payer: MEDICAID | Attending: Emergency Medicine | Admitting: Emergency Medicine

## 2024-08-12 DIAGNOSIS — Y900 Blood alcohol level of less than 20 mg/100 ml: Secondary | ICD-10-CM | POA: Diagnosis not present

## 2024-08-12 DIAGNOSIS — R569 Unspecified convulsions: Secondary | ICD-10-CM

## 2024-08-12 DIAGNOSIS — T43621A Poisoning by amphetamines, accidental (unintentional), initial encounter: Secondary | ICD-10-CM | POA: Diagnosis present

## 2024-08-12 DIAGNOSIS — T43012A Poisoning by tricyclic antidepressants, intentional self-harm, initial encounter: Secondary | ICD-10-CM

## 2024-08-12 DIAGNOSIS — T43011A Poisoning by tricyclic antidepressants, accidental (unintentional), initial encounter: Secondary | ICD-10-CM | POA: Diagnosis not present

## 2024-08-12 DIAGNOSIS — T50904A Poisoning by unspecified drugs, medicaments and biological substances, undetermined, initial encounter: Secondary | ICD-10-CM

## 2024-08-12 DIAGNOSIS — R451 Restlessness and agitation: Secondary | ICD-10-CM | POA: Diagnosis not present

## 2024-08-12 DIAGNOSIS — F1090 Alcohol use, unspecified, uncomplicated: Secondary | ICD-10-CM

## 2024-08-12 LAB — URINE DRUG SCREEN, QUALITATIVE (ARMC ONLY)
Amphetamines, Ur Screen: POSITIVE — AB
Barbiturates, Ur Screen: NOT DETECTED
Benzodiazepine, Ur Scrn: NOT DETECTED
Cannabinoid 50 Ng, Ur ~~LOC~~: NOT DETECTED
Cocaine Metabolite,Ur ~~LOC~~: NOT DETECTED
MDMA (Ecstasy)Ur Screen: NOT DETECTED
Methadone Scn, Ur: NOT DETECTED
Opiate, Ur Screen: NOT DETECTED
Phencyclidine (PCP) Ur S: NOT DETECTED
Tricyclic, Ur Screen: POSITIVE — AB

## 2024-08-12 LAB — COMPREHENSIVE METABOLIC PANEL WITH GFR
ALT: 24 U/L (ref 0–44)
AST: 33 U/L (ref 15–41)
Albumin: 3.8 g/dL (ref 3.5–5.0)
Alkaline Phosphatase: 55 U/L (ref 38–126)
Anion gap: 12 (ref 5–15)
BUN: 7 mg/dL (ref 6–20)
CO2: 27 mmol/L (ref 22–32)
Calcium: 8.3 mg/dL — ABNORMAL LOW (ref 8.9–10.3)
Chloride: 101 mmol/L (ref 98–111)
Creatinine, Ser: 0.66 mg/dL (ref 0.61–1.24)
GFR, Estimated: >60 mL/min (ref >60)
Glucose, Bld: 127 mg/dL — ABNORMAL HIGH (ref 70–99)
Potassium: 4.1 mmol/L (ref 3.5–5.1)
Sodium: 140 mmol/L (ref 135–145)
Total Bilirubin: 0.3 mg/dL (ref 0.0–1.2)
Total Protein: 7.6 g/dL (ref 6.5–8.1)

## 2024-08-12 LAB — URINALYSIS, ROUTINE W REFLEX MICROSCOPIC
Bilirubin Urine: NEGATIVE
Glucose, UA: 50 mg/dL — AB
Hgb urine dipstick: NEGATIVE
Ketones, ur: NEGATIVE mg/dL
Leukocytes,Ua: NEGATIVE
Nitrite: NEGATIVE
Protein, ur: NEGATIVE mg/dL
Specific Gravity, Urine: 1.006 (ref 1.005–1.030)
pH: 7 (ref 5.0–8.0)

## 2024-08-12 LAB — CBC WITH DIFFERENTIAL/PLATELET
Abs Immature Granulocytes: 0.02 K/uL (ref 0.00–0.07)
Basophils Absolute: 0 K/uL (ref 0.0–0.1)
Basophils Relative: 0 %
Eosinophils Absolute: 0 K/uL (ref 0.0–0.5)
Eosinophils Relative: 0 %
HCT: 41.7 % (ref 39.0–52.0)
Hemoglobin: 14.8 g/dL (ref 13.0–17.0)
Immature Granulocytes: 0 %
Lymphocytes Relative: 14 %
Lymphs Abs: 1.1 K/uL (ref 0.7–4.0)
MCH: 30.5 pg (ref 26.0–34.0)
MCHC: 35.5 g/dL (ref 30.0–36.0)
MCV: 86 fL (ref 80.0–100.0)
Monocytes Absolute: 0.5 K/uL (ref 0.1–1.0)
Monocytes Relative: 7 %
Neutro Abs: 6.3 K/uL (ref 1.7–7.7)
Neutrophils Relative %: 79 %
Platelets: 251 K/uL (ref 150–400)
RBC: 4.85 MIL/uL (ref 4.22–5.81)
RDW: 13.2 % (ref 11.5–15.5)
WBC: 8 K/uL (ref 4.0–10.5)
nRBC: 0 % (ref 0.0–0.2)

## 2024-08-12 LAB — ACETAMINOPHEN LEVEL: Acetaminophen (Tylenol), Serum: <10 ug/mL — ABNORMAL LOW (ref 10–30)

## 2024-08-12 LAB — BLOOD GAS, VENOUS
Acid-Base Excess: 6 mmol/L — ABNORMAL HIGH (ref 0.0–2.0)
Bicarbonate: 32.2 mmol/L — ABNORMAL HIGH (ref 20.0–28.0)
O2 Saturation: 96.3 %
Patient temperature: 37
pCO2, Ven: 52 mmHg (ref 44–60)
pH, Ven: 7.4 (ref 7.25–7.43)
pO2, Ven: 75 mmHg — ABNORMAL HIGH (ref 32–45)

## 2024-08-12 LAB — LACTIC ACID, PLASMA: Lactic Acid, Venous: 1.8 mmol/L (ref 0.5–1.9)

## 2024-08-12 LAB — MAGNESIUM: Magnesium: 1.9 mg/dL (ref 1.7–2.4)

## 2024-08-12 LAB — CBG MONITORING, ED: Glucose-Capillary: 125 mg/dL — ABNORMAL HIGH (ref 70–99)

## 2024-08-12 LAB — SALICYLATE LEVEL: Salicylate Lvl: <7 mg/dL — ABNORMAL LOW (ref 7.0–30.0)

## 2024-08-12 LAB — CK: Total CK: 214 U/L (ref 49–397)

## 2024-08-12 LAB — ETHANOL: Alcohol, Ethyl (B): <15 mg/dL (ref <15)

## 2024-08-12 LAB — TROPONIN I (HIGH SENSITIVITY): Troponin I (High Sensitivity): <2 ng/L (ref <18)

## 2024-08-12 MED ORDER — LEVETIRACETAM (KEPPRA) 500 MG/5 ML ADULT IV PUSH
60.0000 mg/kg | Freq: Once | INTRAVENOUS | Status: AC
  Administered 2024-08-12: 3750 mg via INTRAVENOUS
  Filled 2024-08-12: qty 37.5

## 2024-08-12 MED ORDER — LORAZEPAM 2 MG/ML IJ SOLN
1.0000 mg | Freq: Once | INTRAMUSCULAR | Status: AC
Start: 2024-08-12 — End: 2024-08-12
  Administered 2024-08-12: 1 mg via INTRAVENOUS
  Filled 2024-08-12: qty 1

## 2024-08-12 MED ORDER — SODIUM CHLORIDE 0.9 % IV BOLUS
1000.0000 mL | Freq: Once | INTRAVENOUS | Status: AC
  Administered 2024-08-12: 1000 mL via INTRAVENOUS

## 2024-08-12 MED ORDER — LORAZEPAM 2 MG/ML IJ SOLN
0.5000 mg | Freq: Once | INTRAMUSCULAR | Status: AC
  Administered 2024-08-12: 0.5 mg via INTRAVENOUS
  Filled 2024-08-12: qty 1

## 2024-08-12 MED ORDER — MAGNESIUM SULFATE 2 GM/50ML IV SOLN
2.0000 g | Freq: Once | INTRAVENOUS | Status: AC
  Administered 2024-08-12: 2 g via INTRAVENOUS
  Filled 2024-08-12: qty 50

## 2024-08-12 MED ORDER — LORAZEPAM 2 MG/ML IJ SOLN: 1.0000 mg | Freq: Once | INTRAMUSCULAR | Status: DC

## 2024-08-12 MED ORDER — LEVETIRACETAM (KEPPRA) 500 MG/5 ML ADULT IV PUSH: 500.0000 mg | Freq: Two times a day (BID) | INTRAVENOUS | Status: DC

## 2024-08-12 MED ORDER — LORAZEPAM 2 MG/ML IJ SOLN
INTRAMUSCULAR | Status: AC
  Administered 2024-08-12: 1 mg via INTRAVENOUS
  Filled 2024-08-12: qty 1

## 2024-08-12 NOTE — ED Notes (Signed)
 [facility]  [facility]  pt  waiting  on  bed

## 2024-08-12 NOTE — ED Notes (Signed)
 This tech initiated fall bundle at this time (socks, armband, bed alarm on). Pt also provided with a warm blanket and denies further needs at this time.

## 2024-08-12 NOTE — ED Notes (Signed)
 Spoke to mother Winton and updated on POC.

## 2024-08-12 NOTE — ED Provider Notes (Signed)
  Physical Exam  BP 107/63   Pulse (!) 115   Temp 98.2 F (36.8 C)   Resp 15   Ht 5' 7 (1.702 m)   Wt 63 kg   SpO2 100%   BMI 21.77 kg/m   Physical Exam  Procedures  Procedures  ED Course / MDM   Clinical Course as of 08/13/24 0028  Tue Aug 12, 2024  1608 S/o from Dr. Ernest: 1M s/p seizure, reported overdose of nortryptiline - more somnolent on re-eval (previously agitated) - CTH neg - reported seizure after CT, tonic-clonic - received 1mg  IV ativan  - became agitated, received more ativan  to get blood work - now sedated - labs reassuring - neuro consulted, will eval - continue to monitor  - getting mag for prolonged qtc, QRS wnl, no bicarb given.  Poison control consulted. - on ivc, ?SI attempt (S/p arugment w/ girlfriend)  TO DO: - f/u neuro recs - Continue to monitor airway (currently protecting) - Anticipate admission vs transfer   [MM]  1621 D/w Dr. Matthews of neuro - recommends transfer for continuous EEG  - transfer to [facility], neuro already aware - keppra load 60mg /kg  [MM]  1624 ED secretary paging Jolynn Pack [MM]  1648 Spoke w/ Jolynn Pack Progressive bed Dr. Sherlon accepting  [MM]  1836 Returned call to pt's mother, peggy hatch, provided update on clinical status - consents to transfer [MM]  1922 Repeat EKG  Sinus rhythm, rate 96, no gross ST elevation or depression beyond 7.  Early repolarization.  QTc improved to 510.  QRS within normal limits at 117. [MM]    Clinical Course User Index [MM] Clarine Ozell LABOR, MD   Medical Decision Making Amount and/or Complexity of Data Reviewed Labs: ordered. Radiology: ordered.  Risk Prescription drug management.          Clarine Ozell LABOR, MD 08/13/24 GLORIANNE

## 2024-08-12 NOTE — ED Notes (Signed)
 RN to bedside to put monitoring equipment in place. Pt begins having tonic clonic seizure for approximately 30 seconds. Pt placed on non-rebreather. MD to bedside.

## 2024-08-12 NOTE — ED Notes (Signed)
MD made aware of poison control recommendations.

## 2024-08-12 NOTE — ED Notes (Signed)
 MD made aware that pt is only responsive to noxious stimuli at this time.

## 2024-08-12 NOTE — ED Triage Notes (Signed)
 Pt BIB [facility] from Home for having a seizure this morning. EMS reports seizure was tonic - clonic lasting 3-5 min. Pt has hx of seizure 5 years ago, does not take any meds for it, has not had one since. EMS states pt started drinking a lot last night due to GF breaking up with him. EMS also reporting pt took 10-15 pills of nortriptyline around 0400 this morning, unknown if this was intentional or not. Medication bottle is not for the pt, unknown where pt received this medicine. Pt mentioned to people in house that he was going outside to vomit, unsure if pt vomited meds or not. EMS states pt has been restless in postictal state however not violent.

## 2024-08-12 NOTE — Plan of Care (Signed)
 Admission requested to [facility] for continuous EEG, ED discussed with neuro on-call Dr. Matthews, who requested admission to [facility], neurology team at [facility] aware for the patient. 28 year old male presents with altered mental status, it does appear overdose on nortriptyline, apparently he has been drinking a lot last night as his girlfriend breaking up with him, reported taking 10 to 15 pills of nortriptyline,  unclear if suicide intent or not, but patient has been involuntary committed by ED due to concern of suicide attempt, patient agitated and restless by EMS, he had witnessed tonic clonic seizures, he received total of 2.5 mg of IV Ativan , despite that he had another tonic-clonic seizure while in ED, he was loaded with Keppra in ED per neurology recommendation, patient is lethargic, but able to protect his airways, QTc is prolonged, [facility] is aware, received IV magnesium, patient  accepted to progressive care unit.  ED physician to maintain involuntary commitment upon transfer.  So patient will need safety sitter once he gets to [facility], and neurology need to be notified as well. Brayton Lye MD

## 2024-08-12 NOTE — ED Provider Notes (Signed)
 [facility] Provider Note    Event Date/Time   First MD Initiated Contact with Patient 08/12/24 1251     (approximate)   History   Seizures and poss OD   HPI  Seth Luna is a 28 y.o. male who comes in for with concerns for possible seizure this morning.  EMS reported a seizure that was tonic-clonic lasting 3 to 5 minutes.  Patient has a history of seizure 5 years ago but does not take any medications for it.  Unclear if he was previously on medications.  Patient reportedly was drinking a lot last night due to his girlfriend breaking up with him and reported taking 10 to 15 pills of nortriptyline around 4:00 this morning unknown if it was intentional or not.  Patient has been restless with EMS although not violent.  Did review a note where patient was admitted for intentional overdose.    Physical Exam   Triage Vital Signs: ED Triage Vitals  Encounter Vitals Group     BP 08/12/24 1246 (!) 140/84     Girls Systolic BP Percentile --      Girls Diastolic BP Percentile --      Boys Systolic BP Percentile --      Boys Diastolic BP Percentile --      Pulse Rate 08/12/24 1246 (!) 116     Resp 08/12/24 1246 16     Temp 08/12/24 1246 98.2 F (36.8 C)     Temp src --      SpO2 08/12/24 1246 100 %     Weight 08/12/24 1247 139 lb (63 kg)     Height 08/12/24 1247 5' 7 (1.702 m)     Head Circumference --      Peak Flow --      Pain Score 08/12/24 1247 Asleep     Pain Loc --      Pain Education --      Exclude from Growth Chart --     Most recent vital signs: Vitals:   08/12/24 1246  BP: (!) 140/84  Pulse: (!) 116  Resp: 16  Temp: 98.2 F (36.8 C)  SpO2: 100%     General: Awake, no distress.  CV:  Good peripheral perfusion.  Resp:  Normal effort.  Abd:  No distention.  Soft nontender Other:  Patient is moving around seems to be agitated.   ED Results / Procedures / Treatments   Labs (all labs ordered are listed, but only abnormal  results are displayed) Labs Reviewed  COMPREHENSIVE METABOLIC PANEL WITH GFR - Abnormal; Notable for the following components:      Result Value   Glucose, Bld 127 (*)    Calcium 8.3 (*)    All other components within normal limits  SALICYLATE LEVEL - Abnormal; Notable for the following components:   Salicylate Lvl <7.0 (*)    All other components within normal limits  ACETAMINOPHEN  LEVEL - Abnormal; Notable for the following components:   Acetaminophen  (Tylenol ), Serum <10 (*)    All other components within normal limits  URINALYSIS, ROUTINE W REFLEX MICROSCOPIC - Abnormal; Notable for the following components:   Color, Urine STRAW (*)    APPearance CLEAR (*)    Glucose, UA 50 (*)    All other components within normal limits  BLOOD GAS, VENOUS - Abnormal; Notable for the following components:   pO2, Ven 75 (*)    Bicarbonate 32.2 (*)    Acid-Base Excess 6.0 (*)  All other components within normal limits  CBG MONITORING, ED - Abnormal; Notable for the following components:   Glucose-Capillary 125 (*)    All other components within normal limits  CBC WITH DIFFERENTIAL/PLATELET  CK  ETHANOL  MAGNESIUM  LACTIC ACID, PLASMA  URINE DRUG SCREEN, QUALITATIVE ([facility] ONLY)  LACTIC ACID, PLASMA  TROPONIN I (HIGH SENSITIVITY)  TROPONIN I (HIGH SENSITIVITY)     EKG  My interpretation of EKG:  Sinus tachycardia rate 124 without any ST elevation, some peaked T waves, normal intervals  RADIOLOGY I have reviewed the ct personally and interpreted no evidence of intracranial hemorrhage   PROCEDURES:  Critical Care performed: Yes, see critical care procedure note(s)  .1-3 Lead EKG Interpretation  Performed by: Ernest Ronal BRAVO, MD Authorized by: Ernest Ronal BRAVO, MD     Interpretation: abnormal     ECG rate:  120   ECG rate assessment: tachycardic     Rhythm: sinus tachycardia     Ectopy: none     Conduction: normal      MEDICATIONS ORDERED IN ED: Medications  magnesium  sulfate IVPB 2 g 50 mL (2 g Intravenous New Bag/Given 08/12/24 1542)  levETIRAcetam (KEPPRA) undiluted injection 3,750 mg (has no administration in time range)  sodium chloride  0.9 % bolus 1,000 mL (1,000 mLs Intravenous New Bag/Given 08/12/24 1428)  sodium chloride  0.9 % bolus 1,000 mL (1,000 mLs Intravenous New Bag/Given 08/12/24 1428)  LORazepam  (ATIVAN ) injection 1 mg (1 mg Intravenous Given 08/12/24 1438)  LORazepam  (ATIVAN ) injection 0.5 mg (0.5 mg Intravenous Given 08/12/24 1447)     IMPRESSION / MDM / ASSESSMENT AND PLAN / ED COURSE  I reviewed the triage vital signs and the nursing notes.   Patient's presentation is most consistent with acute presentation with potential threat to life or bodily function.   Differential: overdose possible SI, electrolyte abnormalities, ICH/cervical fracture- seizures  Initially when I saw patient he was moving around and seemed a little agitated but he would then calm down and sit in the bed.  His pupils were not pinpoint and they were equal size.  Given this concern of him potentially being intoxicated did order CT imaging for his altered mental status evaluate for intercranial hemorrhage.  2:05 PM I was told by nursing staff that patient was not able to get CT and due to agitation went to go reevaluate patient he seems pretty somnolent right now we will add on VBG.  I do not feel like giving him an additional Ativan  will be helpful at this time given how altered he is we will try to get him over to CT again.  Patient went to the CT scanner and then afterwards had a 1 other witnessed seizure by nursing staff when he got to the room he had stopped seizing.  It was generalized tonic-clonic.  He was actually more awake after the seizure and was moving all extremities and then became very agitated  2:36 PM patient is protecting his airway.  Is agitated with nursing staff will need to give an additional 1 mg for his agitation and this was given x  2.  Patient's CT imaging was negative blood work was negative.  Discussed with Dr. Matthews from neurology.  He is seems to be protecting his airway is with nasal stimulation he does move his head and he does move with painful stimuli.  However I do feel like he is still sedated from either being postictal versus substances versus the Ativan  that was given.  There is some concern he might of taken something other than just the medication that we were told but is unclear.  Given we have to avoid phenytoin discussed with neurology and although Keppra is not a good choice long-term because of this potential depression they did recommend doing an IV dose here.  They did recommend transfer to [facility] for EEG and confirmed that EEG was available.  Patient was handed off to oncoming team with plan for transfer.  I did place patient under IVC initially due to my concern for this being an overdose attempt as he has had prior SI attempts however patient then had another seizure which now will prompt patient being transferred.  The patient is on the cardiac monitor to evaluate for evidence of arrhythmia and/or significant heart rate changes.     FINAL CLINICAL IMPRESSION(S) / ED DIAGNOSES   Final diagnoses:  Seizure (HCC)  Overdose of undetermined intent, initial encounter     Rx / DC Orders   ED Discharge Orders     None        Note:  This document was prepared using Dragon voice recognition software and may include unintentional dictation errors.   Ernest Ronal BRAVO, MD 08/12/24 [phone]

## 2024-08-12 NOTE — Consult Note (Signed)
 NEUROLOGY CONSULT NOTE   Date of service: August 12, 2024 Patient Name: Seth Luna MRN:  [id number] DOB:  12-29-1995 Chief Complaint: seizure in setting of drug overdose Requesting Provider: Clarine Ozell LABOR, MD  History of Present Illness  DERVIN VORE is a 28 y.o. male with hx of single prior seizure 5 years ago without identifiable trigger per girlfriend, not on AEDs, who presented with AMS in the setting of drug overdose with 2 witnessed GTC seizures without return to baseline. Per EDP who was able to speak with his girlfriend, he had been drinking a lot last night because his girlfriend was breaking up with him.  He reported during that time that he had taken 10 to 15 pills of nortriptyline.  He is not known to have a prescription for this.  It is not known whether this was a suicide attempt but he was involuntarily committed by the ED due to concern for suicide attempt.  It is unknown if he took any other prescription or recreational drugs.  Patient was initially agitated when EMS arrived on scene and subsequently had a witnessed generalized tonic-clonic seizure for which he received 2.5 mg of IV Ativan  with EMS.  He had another witnessed generalized tonic-clonic seizure in the ED which was witnessed by nursing.  He was given a status load of Keppra 60 mg/kg in the ED.  On my examination patient was lethargic, unable to be aroused to follow commands, withdraws all extremities to noxious stimuli equally, able to protect his airway.  Poison control center was consulted by the ED and gave additional recommendations to avoid phenytoin in the setting of nortriptyline overdose. UDS (+) for TCA, amphetamine screen. Note patient is on trazodone  which may in some cases cause (+) amphetamine screen.   ROS   Unable to ascertain due to AMS, decreased LOC  Past History   Past Medical History:  Diagnosis Date   ADHD (attention deficit hyperactivity disorder)    Substance abuse (HCC)      Past Surgical History:  Procedure Laterality Date   FOOT SURGERY      Family History: History reviewed. No pertinent family history.  Social History  reports that he has been smoking cigarettes. He has never used smokeless tobacco. He reports current alcohol use of about 28.0 standard drinks of alcohol per week. He reports that he does not currently use drugs after having used the following drugs: Marijuana and Methamphetamines.  Allergies  Allergen Reactions   Haldol  [Haloperidol  Lactate] Other (See Comments)    Dystonic reaction    Medications  No current facility-administered medications for this encounter.  Current Outpatient Medications:    escitalopram  (LEXAPRO ) 5 MG tablet, Take 1 tablet (5 mg total) by mouth daily., Disp: 30 tablet, Rfl: 0   Melatonin 3 MG CAPS, Take 1 capsule (3 mg total) by mouth at bedtime as needed., Disp: 20 capsule, Rfl: 0   traZODone  (DESYREL ) 100 MG tablet, Take 1 tablet (100 mg total) by mouth at bedtime as needed for sleep., Disp: 30 tablet, Rfl: 1  Vitals   Vitals:   08/12/24 1730 08/12/24 1745 08/12/24 1800 08/12/24 1815  BP: 109/71  112/74   Pulse: (!) 106 (!) 105 (!) 104   Resp: 11 11    Temp:    (!) 96.5 F (35.8 C)  TempSrc:    Axillary  SpO2: 100% 100% 100%   Weight:      Height:        Body mass index  is 21.77 kg/m.   Physical Exam   Gen: patient lying in bed, obtunded CV: extremities appear well-perfused Resp: normal WOB  Neurologic exam MS: patient lying in bed, obtunded, arousable briefly by sternal rub but not able to follow commands Speech: no attempts to speak tonight CN: PERRL, 3mm bilat, (+) corneals, oculocephalics, face symmetric at rest Motor & sensory: withdraws to noxious stimuli in all extremities equally Coordination, gait: UTA   Labs/Imaging/Neurodiagnostic studies   CBC:  Recent Labs  Lab August 13, 2024 1318  WBC 8.0  NEUTROABS 6.3  HGB 14.8  HCT 41.7  MCV 86.0  PLT 251   Basic Metabolic  Panel:  Lab Results  Component Value Date   NA 140 13-Aug-2024   K 4.1 2024/08/13   CO2 27 2024/08/13   GLUCOSE 127 (H) Aug 13, 2024   BUN 7 13-Aug-2024   CREATININE 0.66 08-13-2024   CALCIUM 8.3 (L) 08/13/2024   GFRNONAA >60 Aug 13, 2024   GFRAA >60 05/28/2020   Lipid Panel: No results found for: LDLCALC HgbA1c: No results found for: HGBA1C Urine Drug Screen:     Component Value Date/Time   LABOPIA NONE DETECTED 2024-08-13 1553   COCAINSCRNUR NONE DETECTED 2024/08/13 1553   LABBENZ NONE DETECTED 08-13-2024 1553   AMPHETMU POSITIVE (A) 2024-08-13 1553   THCU NONE DETECTED 2024-08-13 1553   LABBARB NONE DETECTED 08-13-2024 1553    Alcohol Level     Component Value Date/Time   ETH <15 2024-08-13 1318   INR No results found for: INR APTT No results found for: APTT AED levels: No results found for: PHENYTOIN, ZONISAMIDE, LAMOTRIGINE, LEVETIRACETA  CT Head without contrast(Personally reviewed): No acute process  ASSESSMENT   JAMONTE CURFMAN is a 28 y.o. male with hx of single prior seizure 5 years ago without identifiable trigger per girlfriend, not on AEDs, who presented with AMS in the setting of drug overdose with 2 witnessed GTC seizures without return to baseline. He is thought to have taken 10-15 pills nortriptyline not prescribed to him as well as binge drinking EtOH due to gf breaking up with him. IVC'd by ED. He was loaded with 60mg /kg keppra and will be transferred to [facility] for cEEG. He was loaded with keppra 2/2 known nortriptyline overdose (phenytoin contraindicated) and collateral history of EtOH abuse (VPA not given instead). Given his suspected suicide attempt keppra would not be an appropriate medication for him to continue longer term than a few days (until hopefully we are able to obtain history from him).  RECOMMENDATIONS   - LEV 60mg /kg load in ED, continue keppra 500mg  bid IV for now - Transfer to [facility] for cEEG  Please contact overnight  neurohospitalist Dr. Aisha Seals with any concerns about the patient prior to transfer. He is expecting patient and will arrange cEEG hookup on arrival to [facility]. ______________________________________________________________________    Signed, Elida CHRISTELLA Ross, MD [facility]\

## 2024-08-12 NOTE — ED Notes (Addendum)
 RN called poison control. Poison control recommended supplementing magnesium and repeat EKG.

## 2024-08-12 NOTE — ED Notes (Addendum)
 This RN speaking with poison control:  Concern that nortriptyline is toxic, will watch for tachycardia, AMS, CNS and resp depression.   Recommends keeping eye on QTC for widening. If greater than give sodium bicarb 1-2meq/kg IV push to reduce QTC and repeat EKG. For prolonged QTC also recommend potassium and magnesium to be on the high end normal (K 4.2, Mag 2.2) If any dysrhythmias give bicarb followed by lidocaine . PC would like repeat EKG every four hours. Give fluids for tachycardia, if pt seizes again give Benzo or phenobarbital. If pt does get intubated, recc activated charcoal when OG/NG confirmed.  AVOID: Betablockers and phenytoin/dilantin  PC states would watch 6-8 hrs after arrival. Can be cleared when vitals are WNL and pt at baseline.

## 2024-08-12 NOTE — ED Notes (Signed)
 Per poison control, repeat EKG at 4 hours after previous (1911).

## 2024-08-13 ENCOUNTER — Inpatient Hospital Stay (HOSPITAL_COMMUNITY)
Admission: EM | Admit: 2024-08-13 | Discharge: 2024-08-17 | DRG: 918 | Disposition: A | Discharge status: Other Acute Inpatient Hospital | Payer: MEDICAID | Source: Other Acute Inpatient Hospital | Attending: Internal Medicine | Admitting: Internal Medicine

## 2024-08-13 ENCOUNTER — Other Ambulatory Visit: Payer: Self-pay

## 2024-08-13 ENCOUNTER — Inpatient Hospital Stay (HOSPITAL_COMMUNITY): Payer: MEDICAID

## 2024-08-13 ENCOUNTER — Encounter (HOSPITAL_COMMUNITY): Payer: Self-pay | Admitting: Internal Medicine

## 2024-08-13 DIAGNOSIS — F1510 Other stimulant abuse, uncomplicated: Secondary | ICD-10-CM | POA: Diagnosis present

## 2024-08-13 DIAGNOSIS — G8929 Other chronic pain: Secondary | ICD-10-CM | POA: Diagnosis present

## 2024-08-13 DIAGNOSIS — T887XXA Unspecified adverse effect of drug or medicament, initial encounter: Secondary | ICD-10-CM | POA: Diagnosis not present

## 2024-08-13 DIAGNOSIS — T43011A Poisoning by tricyclic antidepressants, accidental (unintentional), initial encounter: Secondary | ICD-10-CM | POA: Diagnosis present

## 2024-08-13 DIAGNOSIS — F10221 Alcohol dependence with intoxication delirium: Secondary | ICD-10-CM

## 2024-08-13 DIAGNOSIS — F17210 Nicotine dependence, cigarettes, uncomplicated: Secondary | ICD-10-CM | POA: Diagnosis present

## 2024-08-13 DIAGNOSIS — Z888 Allergy status to other drugs, medicaments and biological substances status: Secondary | ICD-10-CM

## 2024-08-13 DIAGNOSIS — T43014A Poisoning by tricyclic antidepressants, undetermined, initial encounter: Secondary | ICD-10-CM | POA: Diagnosis not present

## 2024-08-13 DIAGNOSIS — Z9151 Personal history of suicidal behavior: Secondary | ICD-10-CM | POA: Diagnosis not present

## 2024-08-13 DIAGNOSIS — R569 Unspecified convulsions: Principal | ICD-10-CM

## 2024-08-13 DIAGNOSIS — G40909 Epilepsy, unspecified, not intractable, without status epilepticus: Secondary | ICD-10-CM | POA: Diagnosis present

## 2024-08-13 DIAGNOSIS — Y92009 Unspecified place in unspecified non-institutional (private) residence as the place of occurrence of the external cause: Secondary | ICD-10-CM

## 2024-08-13 DIAGNOSIS — I10 Essential (primary) hypertension: Secondary | ICD-10-CM | POA: Diagnosis present

## 2024-08-13 DIAGNOSIS — T50904A Poisoning by unspecified drugs, medicaments and biological substances, undetermined, initial encounter: Secondary | ICD-10-CM | POA: Diagnosis not present

## 2024-08-13 DIAGNOSIS — F332 Major depressive disorder, recurrent severe without psychotic features: Secondary | ICD-10-CM | POA: Diagnosis present

## 2024-08-13 DIAGNOSIS — F13239 Sedative, hypnotic or anxiolytic dependence with withdrawal, unspecified: Secondary | ICD-10-CM | POA: Diagnosis present

## 2024-08-13 DIAGNOSIS — R000 Tachycardia, unspecified: Secondary | ICD-10-CM | POA: Diagnosis not present

## 2024-08-13 DIAGNOSIS — F410 Panic disorder [episodic paroxysmal anxiety] without agoraphobia: Secondary | ICD-10-CM | POA: Diagnosis present

## 2024-08-13 DIAGNOSIS — F10229 Alcohol dependence with intoxication, unspecified: Secondary | ICD-10-CM | POA: Diagnosis present

## 2024-08-13 DIAGNOSIS — F909 Attention-deficit hyperactivity disorder, unspecified type: Secondary | ICD-10-CM | POA: Diagnosis present

## 2024-08-13 DIAGNOSIS — F1110 Opioid abuse, uncomplicated: Secondary | ICD-10-CM | POA: Diagnosis present

## 2024-08-13 DIAGNOSIS — Z79899 Other long term (current) drug therapy: Secondary | ICD-10-CM | POA: Diagnosis not present

## 2024-08-13 DIAGNOSIS — Z555 Less than a high school diploma: Secondary | ICD-10-CM

## 2024-08-13 DIAGNOSIS — F603 Borderline personality disorder: Secondary | ICD-10-CM | POA: Diagnosis present

## 2024-08-13 DIAGNOSIS — T43012A Poisoning by tricyclic antidepressants, intentional self-harm, initial encounter: Secondary | ICD-10-CM | POA: Diagnosis present

## 2024-08-13 DIAGNOSIS — Z7401 Bed confinement status: Secondary | ICD-10-CM | POA: Diagnosis not present

## 2024-08-13 DIAGNOSIS — F1090 Alcohol use, unspecified, uncomplicated: Secondary | ICD-10-CM | POA: Insufficient documentation

## 2024-08-13 DIAGNOSIS — T50901A Poisoning by unspecified drugs, medicaments and biological substances, accidental (unintentional), initial encounter: Secondary | ICD-10-CM | POA: Diagnosis present

## 2024-08-13 LAB — CBC
HCT: 40.3 % (ref 39.0–52.0)
Hemoglobin: 13.7 g/dL (ref 13.0–17.0)
MCH: 30.4 pg (ref 26.0–34.0)
MCHC: 34 g/dL (ref 30.0–36.0)
MCV: 89.4 fL (ref 80.0–100.0)
Platelets: 275 K/uL (ref 150–400)
RBC: 4.51 MIL/uL (ref 4.22–5.81)
RDW: 13.6 % (ref 11.5–15.5)
WBC: 8.1 K/uL (ref 4.0–10.5)
nRBC: 0 % (ref 0.0–0.2)

## 2024-08-13 LAB — COMPREHENSIVE METABOLIC PANEL WITH GFR
ALT: 20 U/L (ref 0–44)
AST: 26 U/L (ref 15–41)
Albumin: 3.1 g/dL — ABNORMAL LOW (ref 3.5–5.0)
Alkaline Phosphatase: 54 U/L (ref 38–126)
Anion gap: 8 (ref 5–15)
BUN: <5 mg/dL — ABNORMAL LOW (ref 6–20)
CO2: 23 mmol/L (ref 22–32)
Calcium: 8.1 mg/dL — ABNORMAL LOW (ref 8.9–10.3)
Chloride: 107 mmol/L (ref 98–111)
Creatinine, Ser: 0.72 mg/dL (ref 0.61–1.24)
GFR, Estimated: >60 mL/min
Glucose, Bld: 87 mg/dL (ref 70–99)
Potassium: 4 mmol/L (ref 3.5–5.1)
Sodium: 138 mmol/L (ref 135–145)
Total Bilirubin: 0.7 mg/dL (ref 0.0–1.2)
Total Protein: 5.5 g/dL — ABNORMAL LOW (ref 6.5–8.1)

## 2024-08-13 LAB — CBG MONITORING, ED: Glucose-Capillary: 98 mg/dL (ref 70–99)

## 2024-08-13 LAB — HIV ANTIBODY (ROUTINE TESTING W REFLEX): HIV Screen 4th Generation wRfx: NONREACTIVE

## 2024-08-13 LAB — AMMONIA: Ammonia: 57 umol/L — ABNORMAL HIGH (ref 9–35)

## 2024-08-13 LAB — TSH: TSH: 2.313 u[IU]/mL (ref 0.350–4.500)

## 2024-08-13 LAB — MAGNESIUM: Magnesium: 2.5 mg/dL — ABNORMAL HIGH (ref 1.7–2.4)

## 2024-08-13 LAB — VITAMIN B12: Vitamin B-12: 229 pg/mL (ref 180–914)

## 2024-08-13 MED ORDER — DEXTROSE 50 % IV SOLN
1.0000 | Freq: Once | INTRAVENOUS | Status: AC
  Administered 2024-08-13: 50 mL via INTRAVENOUS
  Filled 2024-08-13 (×2): qty 50

## 2024-08-13 MED ORDER — ACETAMINOPHEN 650 MG RE SUPP: 650.0000 mg | Freq: Four times a day (QID) | RECTAL | Status: DC | PRN

## 2024-08-13 MED ORDER — FOLIC ACID 1 MG PO TABS
1.0000 mg | ORAL_TABLET | Freq: Every day | ORAL | Status: DC
  Administered 2024-08-13 – 2024-08-17 (×5): 1 mg via ORAL
  Filled 2024-08-13 (×5): qty 1

## 2024-08-13 MED ORDER — ADULT MULTIVITAMIN W/MINERALS CH
1.0000 | ORAL_TABLET | Freq: Every day | ORAL | Status: DC
  Administered 2024-08-13 – 2024-08-17 (×5): 1 via ORAL
  Filled 2024-08-13 (×5): qty 1

## 2024-08-13 MED ORDER — DEXTROSE-SODIUM CHLORIDE 5-0.45 % IV SOLN: INTRAVENOUS | Status: AC

## 2024-08-13 MED ORDER — MAGNESIUM SULFATE IN D5W 1-5 GM/100ML-% IV SOLN
1.0000 g | Freq: Once | INTRAVENOUS | Status: AC
  Administered 2024-08-13: 1 g via INTRAVENOUS
  Filled 2024-08-13: qty 100

## 2024-08-13 MED ORDER — LEVETIRACETAM (KEPPRA) 500 MG/5 ML ADULT IV PUSH: 500.0000 mg | Freq: Two times a day (BID) | INTRAVENOUS | Status: DC

## 2024-08-13 MED ORDER — THIAMINE MONONITRATE 100 MG PO TABS
100.0000 mg | ORAL_TABLET | Freq: Every day | ORAL | Status: DC
  Administered 2024-08-13 – 2024-08-17 (×5): 100 mg via ORAL
  Filled 2024-08-13 (×5): qty 1

## 2024-08-13 MED ORDER — LORAZEPAM 2 MG/ML IJ SOLN: 1.0000 mg | INTRAMUSCULAR | Status: AC | PRN

## 2024-08-13 MED ORDER — SODIUM CHLORIDE 0.9% FLUSH
3.0000 mL | Freq: Two times a day (BID) | INTRAVENOUS | Status: DC
  Administered 2024-08-13 – 2024-08-17 (×8): 3 mL via INTRAVENOUS

## 2024-08-13 MED ORDER — LORAZEPAM 1 MG PO TABS: 1.0000 mg | ORAL_TABLET | ORAL | Status: AC | PRN

## 2024-08-13 MED ORDER — THIAMINE HCL 100 MG/ML IJ SOLN
100.0000 mg | Freq: Every day | INTRAMUSCULAR | Status: DC
  Filled 2024-08-13: qty 2

## 2024-08-13 MED ORDER — ENOXAPARIN SODIUM 40 MG/0.4ML IJ SOSY
40.0000 mg | PREFILLED_SYRINGE | INTRAMUSCULAR | Status: DC
  Administered 2024-08-14 – 2024-08-16 (×3): 40 mg via SUBCUTANEOUS
  Filled 2024-08-13 (×4): qty 0.4

## 2024-08-13 MED ORDER — LORAZEPAM 2 MG/ML IJ SOLN: 2.0000 mg | INTRAMUSCULAR | Status: DC | PRN

## 2024-08-13 MED ORDER — POTASSIUM CHLORIDE IN NACL 20-0.9 MEQ/L-% IV SOLN
INTRAVENOUS | Status: DC
  Filled 2024-08-13: qty 1000

## 2024-08-13 MED ORDER — ACETAMINOPHEN 325 MG PO TABS
650.0000 mg | ORAL_TABLET | Freq: Four times a day (QID) | ORAL | Status: DC | PRN
  Administered 2024-08-16: 650 mg via ORAL
  Filled 2024-08-13: qty 2

## 2024-08-13 MED ORDER — POTASSIUM CHLORIDE 2 MEQ/ML IV SOLN
INTRAVENOUS | Status: DC
  Filled 2024-08-13: qty 1000

## 2024-08-13 NOTE — ED Notes (Signed)
 Floor notified pt will be coming up soon.

## 2024-08-13 NOTE — ED Notes (Signed)
 IVC/Transported to [facility] to [facility]

## 2024-08-13 NOTE — ED Provider Notes (Signed)
 Seth Luna EMERGENCY DEPARTMENT AT [facility] Provider Note   CSN: [id number] Arrival date & time: 08/13/24  9770     Patient presents with: No chief complaint on file.   Seth Luna is a 28 y.o. male.   The history is provided by the patient and medical records.  Seth Luna is a 28 y.o. male who presents to the Emergency Department complaining of overdose. He presents the emergency department by critical care transport from [facility] for ongoing neurologic evaluation following overdose on tricyclic antidepressants. Patient is unable to provide meaningful history at time of ED arrival.     Prior to Admission medications   Medication Sig Start Date End Date Taking? Authorizing Provider  escitalopram  (LEXAPRO ) 5 MG tablet Take 1 tablet (5 mg total) by mouth daily. 04/21/22   Seth Pillion, MD  Melatonin 3 MG CAPS Take 1 capsule (3 mg total) by mouth at bedtime as needed. 04/21/22   Seth Pillion, MD  traZODone  (DESYREL ) 100 MG tablet Take 1 tablet (100 mg total) by mouth at bedtime as needed for sleep. 04/21/22   Seth Luna, Seth DASEN, MD    Allergies: Haldol  [haloperidol  lactate]    Review of Systems  Unable to perform ROS: Mental status change    Updated Vital Signs BP (!) 118/90   Pulse (!) 117   Resp (!) 22   SpO2 100%   Physical Exam Vitals and nursing note reviewed.  Constitutional:      Appearance: He is well-developed.     Comments: Lethargic.  HENT:     Head: Normocephalic and atraumatic.     Comments: Dry mucous membranes Cardiovascular:     Rate and Rhythm: Regular rhythm. Tachycardia present.     Heart sounds: No murmur heard. Pulmonary:     Effort: Pulmonary effort is normal. No respiratory distress.     Breath sounds: Normal breath sounds.  Abdominal:     Palpations: Abdomen is soft.     Tenderness: There is no abdominal tenderness. There is no guarding or rebound.  Musculoskeletal:        General: No tenderness.   Skin:    General: Skin is warm and dry.  Neurological:     Comments: Lethargic with dysarthric speech. He does awaken to verbal stimuli. He moves all extremities symmetrically but weekly.     (all labs ordered are listed, but only abnormal results are displayed) Labs Reviewed  CBG MONITORING, ED    EKG: EKG Interpretation Date/Time:  Wednesday August 13 2024 03:15:09 EDT Ventricular Rate:  114 PR Interval:  145 QRS Duration:  107 QT Interval:  365 QTC Calculation: 503 R Axis:   77  Text Interpretation: Sinus tachycardia ST elevation suggests acute pericarditis Prolonged QT interval Confirmed by Seth Luna [id number]) on 08/13/2024 3:23:59 AM  Radiology: DG Chest Portable 1 View Result Date: 08/12/2024 EXAM: 1 VIEW(S) XRAY OF THE CHEST 08/12/2024 04:02:00 PM COMPARISON: 01/04/2024 CLINICAL HISTORY: AMS. FINDINGS: LUNGS AND PLEURA: Mildly lower lung volumes. No focal pulmonary opacity. No pulmonary edema. No pleural effusion. No pneumothorax. HEART AND MEDIASTINUM: No acute abnormality of the cardiac and mediastinal silhouettes. BONES AND SOFT TISSUES: No acute osseous abnormality. IMPRESSION: 1. No acute cardiopulmonary process. Electronically signed by: Seth Calk MD 08/12/2024 04:41 PM EDT RP Workstation: [id number]   CT Cervical Spine Wo Contrast Result Date: 08/12/2024 CLINICAL DATA:  Ataxia, cervical trauma Seizure this morning. EXAM: CT CERVICAL SPINE WITHOUT CONTRAST TECHNIQUE: Multidetector CT imaging of the cervical  spine was performed without intravenous contrast. Multiplanar CT image reconstructions were also generated. RADIATION DOSE REDUCTION: This exam was performed according to the departmental dose-optimization program which includes automated exposure control, adjustment of the mA and/or kV according to patient size and/or use of iterative reconstruction technique. COMPARISON:  CT cervical spine 02/14/2014. FINDINGS: Alignment: Straightening without focal  angulation or listhesis. Skull base and vertebrae: No evidence of acute cervical spine fracture or traumatic subluxation. Soft tissues and spinal canal: No prevertebral fluid or swelling. No visible canal hematoma. Disc levels: The cervical disc heights are maintained. No evidence of significant disc herniation, spinal stenosis or foraminal narrowing. Upper chest: Clear lung apices. Other: None. IMPRESSION: 1. No evidence of acute cervical spine fracture, traumatic subluxation or static signs of instability. 2. Straightening of the cervical spine which may be positional or secondary to muscle spasm. Electronically Signed   By: Seth Luna M.D.   On: 08/12/2024 14:45   CT HEAD WO CONTRAST ( ) Result Date: 08/12/2024 EXAM: CT HEAD WITHOUT CONTRAST 08/12/2024 02:31:14 PM TECHNIQUE: CT of the head was performed without the administration of intravenous contrast. Automated exposure control, iterative reconstruction, and/or weight based adjustment of the mA/kV was utilized to reduce the radiation dose to as low as reasonably achievable. COMPARISON: None available. CLINICAL HISTORY: Head trauma, abnormal mental status (Age 28-64y). Triage note: Pt BIB [facility] from Home for having a seizure this morning. EMS reports seizure was tonic - clonic lasting 3-5 min. Pt has hx of seizure 5 years ago, does not take any meds for it, has not had one since. EMS states pt started drinking a lot last night due to GF breaking up with him. EMS also reporting pt took 10-15 pills of nortriptyline around 0400 this morning, unknown if this was intentional or not. Medication bottle is not for the pt, unknown where pt received this medicine. Pt mentioned to people in house that he was outside to vomit, unsure if pt vomited meds or not. EMS states pt has been restless in postictal state however not violent. FINDINGS: BRAIN AND VENTRICLES: No acute hemorrhage. No evidence of acute infarct. No hydrocephalus. No extra-axial collection. No  mass effect or midline shift. ORBITS: No acute abnormality. SINUSES: No acute abnormality. SOFT TISSUES AND SKULL: No acute soft tissue abnormality. Remote nasal bone fracture. No skull fracture. IMPRESSION: 1. No acute intracranial abnormality. 2. Remote nasal bone fracture. Electronically signed by: Donnice Mania MD 08/12/2024 02:41 PM EDT RP Workstation: [id number]     Procedures   Medications Ordered in the ED - No data to display                                  Medical Decision Making Risk Decision regarding hospitalization.  Patient transferred from [facility] for continuous EEG due to concern for possible non-convulsive status. Patient is somnolent but does awaken to verbal stimuli on evaluation, unable to obtain additional meaningful history. Discussed with Dr. Michaela with neurology regarding EEG monitoring. Medicine consulted for admission.     Final diagnoses:  Tricyclic antidepressant overdose of undetermined intent, initial encounter    ED Discharge Orders     None          Seth Norris, MD 08/13/24 [phone]
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
## 2024-08-13 NOTE — ED Notes (Signed)
 Per poison control fluids will help with tachycardia and replenish electrolyte. Recommended Mag above 2 and Potassium above 4 and EKG 4-6 hours . Additional doses of benzos if agitation or seizure like activity if needed.

## 2024-08-13 NOTE — ED Notes (Signed)
 IVC paperwork taken to [facility] [address]

## 2024-08-13 NOTE — ED Notes (Signed)
 Psych MD and residents came to bedside and informed Charge RN and Primary RN they are going to IVC him.

## 2024-08-13 NOTE — ED Notes (Signed)
 Called pt's mother and updated her that transport was here to transport pt to [facility].

## 2024-08-13 NOTE — Progress Notes (Signed)
 LTM EEG hooked up and running - no initial skin breakdown - push button tested - Not monitored by [facility] while in the ER

## 2024-08-13 NOTE — ED Notes (Signed)
 [facility] department called Clinical research associate at this time to inform that no officer available and would not be able to assist with transfer and would call back when able to send an Technical sales engineer. EDP, assigned RN, and Diplomatic Services operational officer made aware.

## 2024-08-13 NOTE — Progress Notes (Signed)
 Subjective: No further seizures overnight.  Sitter at bedside denies any new concerns.  ROS: negative except above Examination  Vital signs in last 24 hours: Temp:  [96.5 F (35.8 C)-98.2 F (36.8 C)] 97.8 F (36.6 C) (10/22 0718) Pulse Rate:  [86-130] 98 (10/22 0930) Resp:  [9-30] 18 (10/22 0930) BP: (97-143)/(63-97) 126/80 (10/22 0930) SpO2:  [97 %-100 %] 97 % (10/22 0830) Weight:  [63 kg] 63 kg (10/21 1247)  General: lying in bed, NAD Neuro: MS: Drowsy but opens eyes to verbal stimulation alert, oriented, follows commands CN: pupils equal and reactive,  EOMI, face symmetric, tongue midline, normal sensation over face, Motor: 5/5 strength in all 4 extremities Sensory: Intact to light touch Coordination: normal Gait: not tested  Basic Metabolic Panel: Recent Labs  Lab 08/12/24 1318 08/13/24 0440  NA 140 138  K 4.1 4.0  CL 101 107  CO2 27 23  GLUCOSE 127* 87  BUN 7 <5*  CREATININE 0.66 0.72  CALCIUM 8.3* 8.1*  MG 1.9 2.5*    CBC: Recent Labs  Lab 08/12/24 1318 08/13/24 0440  WBC 8.0 8.1  NEUTROABS 6.3  --   HGB 14.8 13.7  HCT 41.7 40.3  MCV 86.0 89.4  PLT 251 275     Coagulation Studies: No results for input(s): LABPROT, INR in the last 72 hours.  Imaging personally reviewed  CT head without contrast 08/12/2024: No acute intracranial abnormality. 2. Remote nasal bone fracture.   ASSESSMENT AND PLAN: 28 y.o. male with hx of single prior seizure 5 years ago without identifiable trigger per girlfriend, not on AEDs, who presented with AMS in the setting of drug overdose with 2 witnessed GTC seizures without return to baseline. He is thought to have taken 10-15 pills nortriptyline not prescribed to him as well as binge drinking EtOH due to gf breaking up with him. IVC'd by ED. He was loaded with 60mg /kg keppra and will be transferred to [facility] for cEEG. He was loaded with keppra 2/2 known nortriptyline overdose (phenytoin contraindicated) and collateral  history of EtOH abuse (VPA not given instead). Given his suspected suicide attempt keppra would not be an appropriate medication for him to continue longer term than a few days (until hopefully we are able to obtain history from him).   Seizure-like activity Overdose on medications(nortriptyline) -No further seizures overnight  Recommendations -Continue LTM EEG for few more hours to monitor for seizures - If no seizures, will DC and obtain MRI brain without contrast - Psych consult pending - Will need to get more history about prior seizures to decide if he needs to be on long-term antiseizure medications are not -Continue seizure precautions for now -Discussed plan with Dr. Caleen via secure chat    I personally spent a total of 36 minutes in the care of the patient today including getting/reviewing separately obtained history, performing a medically appropriate exam/evaluation, counseling and educating, placing orders, referring and communicating with other health care professionals, documenting clinical information in the EHR, independently interpreting results, and coordinating care.            Seth Luna Epilepsy [facility] For questions after 5pm please refer to AMION to reach the Neurologist on call

## 2024-08-13 NOTE — ED Notes (Signed)
 Contacted phlebotomy for blood work.

## 2024-08-13 NOTE — ED Triage Notes (Signed)
 Pt received as [facility] transfer via [facility]; pt arrives as ED to ED transfer requiring Neuro consult and EEG. Per [facility]: pt seen after tonic clonic seizures at home, found with 10-15 pills missing from empty bottle beside him at 12:42 on 10/21. While at [facility], pt had multiple seizures and doses of Ativan  given. Arrives to [facility] ED responsive to pain and able to state name.

## 2024-08-13 NOTE — Procedures (Signed)
 Patient Name: Seth Luna  MRN: [id number]  Epilepsy Attending: Arlin MALVA Krebs  Referring Physician/Provider: Michaela Aisha SQUIBB, MD  Duration: 08/13/2024 0511 to 2150  Patient history: 28 y.o. male with hx of single prior seizure 5 years ago without identifiable trigger per girlfriend, not on AEDs, who presented with AMS in the setting of drug overdose with 2 witnessed GTC seizures without return to baseline. EEG to evaluate for seizure  Level of alertness: Awake, asleep  AEDs during EEG study: None  Technical aspects: This EEG study was done with scalp electrodes positioned according to the 10-20 International system of electrode placement. Electrical activity was reviewed with band pass filter of 1-70Hz , sensitivity of 7 uV/mm, display speed of 64mm/sec with a 60Hz  notched filter applied as appropriate. EEG data were recorded continuously and digitally stored.  Video monitoring was available and reviewed as appropriate.  Description: The posterior dominant rhythm consists of 8-9 Hz activity of moderate voltage (25-35 uV) seen predominantly in posterior head regions, symmetric and reactive to eye opening and eye closing. Sleep was characterized by vertex waves, sleep spindles (12 to 14 Hz), maximal frontocentral region. Hyperventilation and photic stimulation were not performed.     IMPRESSION: This study is within normal limits. No seizures or epileptiform discharges were seen throughout the recording.  A normal interictal EEG does not exclude the diagnosis of epilepsy.   Konstantina Nachreiner O Janasia Coverdale

## 2024-08-13 NOTE — ED Notes (Signed)
 EEG being set up at bedside.

## 2024-08-13 NOTE — H&P (Signed)
 History and Physical    Patient: Seth Luna FMW:[id number] DOB: [birth year] DOA: 08/13/2024 DOS: the patient was seen and examined on 08/13/2024 PCP: Patient, No Pcp Per  Patient coming from: Home  Chief Complaint:  Chief Complaint  Patient presents with   Seizures   Drug Overdose   HPI: Seth Luna is a 28 y.o. male with medical history significant of ADHD, depression and history of substance abuse.  Patient presented to the ED on 10/21 via EMS after having seizure at home that lasted 3 to 5 minutes apparently has a history of seizures 5 years ago but not on AEDs.  EMS reported to triage staff that the patient started drinking a significant volume the previous night due to girlfriend breaking up with him and took 10 to 15 pills of nortriptyline about 4:00 this morning.  Unknown if this was intentional in regards to suicide attempt or just taken to get high.  Witnesses at the home reported to EMS that he was going outside to vomit and unsure if he vomited up these same medications or not.  EMS reported the patient has been restless in a postictal state but was not violent.  He was initially placed under IVC while in the [facility] ED  He essentially remained unresponsible while in the [facility] ED.  CT of the head without any acute intracranial abnormality.  CT cervical spine without evidence of acute cervical spine fracture, traumatic subluxation or static signs of instability.  Chest x-ray did not show any acute cardiopulmonary process.  Initial labs demonstrated a sodium of 140, potassium 4.1, CO2 27 with normal anion gap, glucose 127, BUN 7, creatinine 0.66, LFTs normal, CK2 14, lactic acid 1.8, WBCs 8000 with normal differential, hemoglobin 14.8 and platelets 251,000.  Alcohol level was less than 15 and salicylate level less than 7.  Urine drug screen was positive for amphetamines as well as tricyclic antidepressants.  EDP discussed with poison control who recommended telemetry monitoring and  serial EKGs, keeping potassium and magnesium at higher end of normal.  Neurology team was consulted and recommendation was to transfer to [facility] for long-term EEG monitoring.  Patient remained unresponsive and IVC was temporarily rescinded to facilitate transfer [facility] to [facility].  After arrival to [facility] patient remained unresponsive although apparently during transition from ED stretcher to [facility] stretcher patient awake enough to become mildly combative with [facility] staff but then became unresponsive again.  Last twelve-lead EKG around 6 AM demonstrated a QTc of 496 ms.  Patient otherwise was stable with no hypoxemia and normal vital signs and no QRS widening on telemetry monitoring.  Hospitalist service has been asked to evaluate the patient for admission.   Review of Systems: As mentioned in the history of present illness. All other systems reviewed and are negative.   Past Medical History:  Diagnosis Date   ADHD (attention deficit hyperactivity disorder)    Substance abuse (HCC)    Past Surgical History:  Procedure Laterality Date   FOOT SURGERY     Social History:  reports that he has been smoking cigarettes. He has never used smokeless tobacco. He reports current alcohol use of about 28.0 standard drinks of alcohol per week. He reports that he does not currently use drugs after having used the following drugs: Marijuana and Methamphetamines.  Allergies  Allergen Reactions   Haldol  [Haloperidol  Lactate] Other (See Comments)    Dystonic reaction   Haloperidol  And Related     History reviewed. No  pertinent family history.  Prior to Admission medications   Medication Sig Start Date End Date Taking? Authorizing Provider  escitalopram  (LEXAPRO ) 5 MG tablet Take 1 tablet (5 mg total) by mouth daily. 04/21/22   Laurita Pillion, MD  Melatonin 3 MG CAPS Take 1 capsule (3 mg total) by mouth at bedtime as needed. 04/21/22   Laurita Pillion, MD  traZODone  (DESYREL ) 100 MG  tablet Take 1 tablet (100 mg total) by mouth at bedtime as needed for sleep. 04/21/22   Clapacs, Norleen DASEN, MD    Physical Exam: Vitals:   08/13/24 1126 08/13/24 1145 08/13/24 1200 08/13/24 1215  BP: 104/65 104/74 109/82 112/82  Pulse: 90 85 84 85  Resp: 12 12 12 11   Temp: 97.6 F (36.4 C)     TempSrc: Oral     SpO2: 100%      Constitutional: NAD, calm, comfortable in context of lethargy Respiratory: clear to auscultation bilaterally, no wheezing, no crackles. Normal respiratory effort. No accessory muscle use.  Room air Cardiovascular: Regular rate and rhythm, no murmurs / rubs / gallops. No extremity edema. 2+ pedal pulses. No carotid bruits.  Abdomen: Appears to be nontender with palpation, no masses palpated. No hepatosplenomegaly. Bowel sounds positive.  Musculoskeletal: no clubbing / cyanosis. No joint deformity upper and lower extremities.  Skin: no rashes, lesions, ulcers. No induration Neurologic: CN 2-12 grossly intact although pupils were 3 mm and sluggish to react.  Essentially unresponsive.  Unable to test sensation or strength. Psychiatric: Unresponsive and lethargic.   Data Reviewed:  As above  Assessment and Plan: Seizure-like activity Patient with apparent witnessed seizure activity duration 3 to 5 minutes at home with postictal phase Reported seizures 5 years prior not on AED As needed Ativan  available for reemergence of seizure activity Appreciate neurology assistance EEG without evidence of seizure activity.  Long-term EEG in process Seizure precaution  TCA overdose/history of depression Unclear if intentional or patient was attempting to enhance effects of alcohol ingested previously during the evening Recent personal stressor of breaking up with girlfriend Appreciate assistance of psychiatry team IVC has been reinstated by psych team and anticipate possible admission to behavioral health once medically stable Continue telemetry monitoring Last EKG with QTc  496 ms.  Follow-up EKG pending at 12 PM Keep potassium and magnesium at high levels been normal  Preadmission meds include trazodone  and Lexapro  Patient did have urine drug screen with positive amphetamines but trazodone  can cause a false positive amphetamine screen  History of ADHD Was not on medication therapy based on prior to admission meds that are documented in the chart   Advance Care Planning:   Code Status: Full Code   VTE prophylaxis: Lovenox   Consults: Psychiatry, neurology  Family Communication: No family at bedside  Severity of Illness: The appropriate patient status for this patient is INPATIENT. Inpatient status is judged to be reasonable and necessary in order to provide the required intensity of service to ensure the patient's safety. The patient's presenting symptoms, physical exam findings, and initial radiographic and laboratory data in the context of their chronic comorbidities is felt to place them at high risk for further clinical deterioration. Furthermore, it is not anticipated that the patient will be medically stable for discharge from the hospital within 2 midnights of admission.   * I certify that at the point of admission it is my clinical judgment that the patient will require inpatient hospital care spanning beyond 2 midnights from the point of admission due to high intensity  of service, high risk for further deterioration and high frequency of surveillance required.*  Author: Isaiah Lever, NP 08/13/2024 1:01 PM  For on call review [email or web address].

## 2024-08-13 NOTE — ED Notes (Signed)
 Upon assessment pt is alert to voice, disoriented to situation, able to slowly follow commands and is calm and cooperative.

## 2024-08-13 NOTE — Progress Notes (Signed)
 LTM maint complete - no skin breakdown under: FZ,FP1. Pt transferred from [id number]. LTM EEG study moved to server and pt is now monitored by [facility].

## 2024-08-13 NOTE — Progress Notes (Signed)
 LTM VIDEO EEG discontinued - no skin breakdown at [facility].

## 2024-08-13 NOTE — ED Notes (Signed)
 CCMD called to put pt on monitor

## 2024-08-13 NOTE — Progress Notes (Signed)
 Pt moved from ED 015 to ED 011. LTM EEG up and running.

## 2024-08-13 NOTE — ED Notes (Signed)
 IVC Rescinded by MD Ward

## 2024-08-13 NOTE — ED Notes (Signed)
 Sitter at bedside to lot rn know when pt urinates again for bladder scan.  PT unable to be awoken enough for 1st neuro check.

## 2024-08-13 NOTE — ED Notes (Signed)
 Petition and First Exam e-filed.  Envelope # [id number]

## 2024-08-13 NOTE — Progress Notes (Signed)
Pt blood sugar was 74

## 2024-08-13 NOTE — ED Notes (Signed)
 New IVC filing, case no. [id number]

## 2024-08-13 NOTE — ED Notes (Signed)
 Pts girlfriend presented to ED lobby asking if she could see patient. This nurse reached out to pts nurse who said visitors were not appropriate at this time. Told pts girlfriend he would not be able to have visitors at this time. Girlfriend asking for medical update but told I was unable to give her any information at this time.

## 2024-08-13 NOTE — ED Provider Notes (Addendum)
 12:50 AM  Spoke with Dr. Michaela with neurology and Dr. Griselda in the ED at [facility].  Given delay in bed availability at [facility] I am concerned still for possible subclinical status epilepticus given patient is only responsive to noxious stimuli, they would like transferred to the emergency department.  [facility] is on the way to get the patient.  1:00 AM  On my reevaluation patient is tachycardic but otherwise hemodynamically stable, sleeping.  He is able to arouse to voice and opens his eyes.  When he is asked his name he has slurred speech but is able to state his first and last name before falling back to sleep.  He appears to be moving all 4 extremities spontaneously but does not follow other commands before falling back to sleep.  He is protecting his airway and does not need intubation.  No hypoxia, apnea.  I have updated neurology and ED provider at [facility].  Less likely status given clinical improvement but they would still like to go ahead and get patient over to [facility] to get him hooked up to EEG monitoring.   CRITICAL CARE Performed by: Seth Luna   Total critical care time: 30 minutes  Critical care time was exclusive of separately billable procedures and treating other patients.  Critical care was necessary to treat or prevent imminent or life-threatening deterioration.  Critical care was time spent personally by me on the following activities: development of treatment plan with patient and/or surrogate as well as nursing, discussions with consultants, evaluation of patient's response to treatment, examination of patient, obtaining history from patient or surrogate, ordering and performing treatments and interventions, ordering and review of laboratory studies, ordering and review of radiographic studies, pulse oximetry and re-evaluation of patient's condition.    Seth Luna, Seth SAILOR, DO 08/13/24 0102   1:15 AM  Charge nurse informed me that there is no [facility] available  at this time to go with patient during transport since he is under involuntary commitment.  Given I do not want to delay his medical care any further, I have rescinded the IVC paperwork.  I have updated the ED physician and neurologist at [facility] regarding this.  I do not think he is awake enough to try to elope but may need IVC paperwork reinstated at a later time at [facility] given the concerns for possible TCA overdose.  Myself, the ED physician at [facility] and the neurologist all agree that it is prudent to get him to [place] as soon as possible for further medical treatment.    Seth Luna, Seth SAILOR, DO 08/13/24 0116    Seth Luna, Seth SAILOR, DO 08/13/24 9697

## 2024-08-14 DIAGNOSIS — F10221 Alcohol dependence with intoxication delirium: Secondary | ICD-10-CM

## 2024-08-14 DIAGNOSIS — T43014A Poisoning by tricyclic antidepressants, undetermined, initial encounter: Secondary | ICD-10-CM | POA: Diagnosis not present

## 2024-08-14 DIAGNOSIS — R569 Unspecified convulsions: Secondary | ICD-10-CM | POA: Diagnosis not present

## 2024-08-14 DIAGNOSIS — F909 Attention-deficit hyperactivity disorder, unspecified type: Secondary | ICD-10-CM

## 2024-08-14 DIAGNOSIS — F1090 Alcohol use, unspecified, uncomplicated: Secondary | ICD-10-CM | POA: Insufficient documentation

## 2024-08-14 DIAGNOSIS — T43012A Poisoning by tricyclic antidepressants, intentional self-harm, initial encounter: Secondary | ICD-10-CM | POA: Diagnosis not present

## 2024-08-14 LAB — BASIC METABOLIC PANEL WITH GFR
Anion gap: 12 (ref 5–15)
BUN: <5 mg/dL — ABNORMAL LOW (ref 6–20)
CO2: 25 mmol/L (ref 22–32)
Calcium: 8.4 mg/dL — ABNORMAL LOW (ref 8.9–10.3)
Chloride: 103 mmol/L (ref 98–111)
Creatinine, Ser: 0.96 mg/dL (ref 0.61–1.24)
GFR, Estimated: >60 mL/min (ref >60)
Glucose, Bld: 109 mg/dL — ABNORMAL HIGH (ref 70–99)
Potassium: 3.8 mmol/L (ref 3.5–5.1)
Sodium: 140 mmol/L (ref 135–145)

## 2024-08-14 LAB — CBC
HCT: 42.9 % (ref 39.0–52.0)
Hemoglobin: 14.8 g/dL (ref 13.0–17.0)
MCH: 30.6 pg (ref 26.0–34.0)
MCHC: 34.5 g/dL (ref 30.0–36.0)
MCV: 88.8 fL (ref 80.0–100.0)
Platelets: 232 K/uL (ref 150–400)
RBC: 4.83 MIL/uL (ref 4.22–5.81)
RDW: 13.5 % (ref 11.5–15.5)
WBC: 5.8 K/uL (ref 4.0–10.5)
nRBC: 0 % (ref 0.0–0.2)

## 2024-08-14 LAB — GLUCOSE, CAPILLARY
Glucose-Capillary: 100 mg/dL — ABNORMAL HIGH (ref 70–99)
Glucose-Capillary: 124 mg/dL — ABNORMAL HIGH (ref 70–99)
Glucose-Capillary: 147 mg/dL — ABNORMAL HIGH (ref 70–99)
Glucose-Capillary: 155 mg/dL — ABNORMAL HIGH (ref 70–99)

## 2024-08-14 LAB — FOLATE: Folate: 11.7 ng/mL (ref >5.9)

## 2024-08-14 LAB — CK: Total CK: 117 U/L (ref 49–397)

## 2024-08-14 MED ORDER — VITAMIN B-12 1000 MCG PO TABS
1000.0000 ug | ORAL_TABLET | Freq: Every day | ORAL | Status: DC
  Administered 2024-08-14 – 2024-08-17 (×4): 1000 ug via ORAL
  Filled 2024-08-14 (×4): qty 1

## 2024-08-14 MED ORDER — FLUOXETINE HCL 20 MG PO CAPS
20.0000 mg | ORAL_CAPSULE | Freq: Every day | ORAL | Status: DC
  Administered 2024-08-14 – 2024-08-17 (×4): 20 mg via ORAL
  Filled 2024-08-14 (×4): qty 1

## 2024-08-14 MED ORDER — NICOTINE 21 MG/24HR TD PT24
21.0000 mg | MEDICATED_PATCH | Freq: Every day | TRANSDERMAL | Status: DC
  Administered 2024-08-14 – 2024-08-17 (×4): 21 mg via TRANSDERMAL
  Filled 2024-08-14 (×4): qty 1

## 2024-08-14 MED ORDER — LACOSAMIDE 50 MG PO TABS
50.0000 mg | ORAL_TABLET | Freq: Two times a day (BID) | ORAL | Status: DC
  Administered 2024-08-14 – 2024-08-17 (×7): 50 mg via ORAL
  Filled 2024-08-14 (×7): qty 1

## 2024-08-14 NOTE — Assessment & Plan Note (Signed)
 Patient is more awake and interactive, stating that he was just upset with no suicidal intention.  Last QTc of 499 and no chest pain.  UDS positive for amphetamines and tricyclic's. -Pending formal psych evaluation

## 2024-08-14 NOTE — Progress Notes (Signed)
 Subjective: No acute events overnight.  States he has had multiple seizures in the past but unable to elaborate further.  States he is feeling a little dizzy.  Denies any other concerns.   ROS: negative except above  Examination  Vital signs in last 24 hours: Temp:  [97.6 F (36.4 C)-98.6 F (37 C)] 98.6 F (37 C) (10/23 0451) Pulse Rate:  [84-101] 98 (10/23 0830) Resp:  [11-18] 18 (10/23 0830) BP: (104-142)/(65-112) 136/90 (10/23 0830) SpO2:  [98 %-100 %] 98 % (10/23 0830)  General: lying in bed,NAD Neuro: MS: Alert, oriented, follows commands CN: pupils equal and reactive,  EOMI, face symmetric, tongue midline, normal sensation over face, Motor: 5/5 strength in all 4 extremities Coordination: normal Gait: not tested  Basic Metabolic Panel: Recent Labs  Lab 08/12/24 1318 08/13/24 0440 08/14/24 0559  NA 140 138 140  K 4.1 4.0 3.8  CL 101 107 103  CO2 27 23 25   GLUCOSE 127* 87 109*  BUN 7 <5* <5*  CREATININE 0.66 0.72 0.96  CALCIUM 8.3* 8.1* 8.4*  MG 1.9 2.5*  --     CBC: Recent Labs  Lab 08/12/24 1318 08/13/24 0440 08/14/24 0559  WBC 8.0 8.1 5.8  NEUTROABS 6.3  --   --   HGB 14.8 13.7 14.8  HCT 41.7 40.3 42.9  MCV 86.0 89.4 88.8  PLT 251 275 232     Coagulation Studies: No results for input(s): LABPROT, INR in the last 72 hours.  Imaging personally reviewed  MRI brain without contrast 08/13/2024: Normal brain MRI.  No likely seizure etiology identified.   ASSESSMENT AND PLAN:28 y.o. male with hx of single prior seizure 5 years ago without identifiable trigger per girlfriend, not on AEDs, who presented with AMS in the setting of drug overdose with 2 witnessed GTC seizures without return to baseline. He is thought to have taken 10-15 pills nortriptyline not prescribed to him as well as binge drinking EtOH due to gf breaking up with him. IVC'd by ED. He was loaded with 60mg /kg keppra and will be transferred to [facility] for cEEG. He was loaded with keppra 2/2  known nortriptyline overdose (phenytoin contraindicated) and collateral history of EtOH abuse (VPA not given instead). Given his suspected suicide attempt keppra would not be an appropriate medication for him to continue longer term than a few days (until hopefully we are able to obtain history from him).     Seizure-like activity Overdose on medications(nortriptyline) -No further seizures overnight - However patient reports multiple seizure-like episodes in the past.  Therefore for now we will go ahead and start Vimpat 50 mg twice daily -Discussed seizure precautions including no driving - Follow-up with neurology in 2 to 3 months (order placed) -Discussed plan with Dr. Caleen via secure chat  Seizure precautions: Per [place]  DMV statutes, patients with seizures are not allowed to drive until they have been seizure-free for six months and cleared by a physician    Use caution when using heavy equipment or power tools. Avoid working on ladders or at heights. Take showers instead of baths. Ensure the water temperature is not too high on the home water heater. Do not go swimming alone. Do not lock yourself in a room alone (i.e. bathroom). When caring for infants or small children, sit down when holding, feeding, or changing them to minimize risk of injury to the child in the event you have a seizure. Maintain good sleep hygiene. Avoid alcohol.    If patient has another  seizure, call 911 and bring them back to the ED if: A.  The seizure lasts longer than 5 minutes.      B.  The patient doesn't wake shortly after the seizure or has new problems such as difficulty seeing, speaking or moving following the seizure C.  The patient was injured during the seizure D.  The patient has a temperature over 102 F (39C) E.  The patient vomited during the seizure and now is having trouble breathing    During the Seizure   - First, ensure adequate ventilation and place patients on the floor on their left  side  Loosen clothing around the neck and ensure the airway is patent. If the patient is clenching the teeth, do not force the mouth open with any object as this can cause severe damage - Remove all items from the surrounding that can be hazardous. The patient may be oblivious to what's happening and may not even know what he or she is doing. If the patient is confused and wandering, either gently guide him/her away and block access to outside areas - Reassure the individual and be comforting - Call 911. In most cases, the seizure ends before EMS arrives. However, there are cases when seizures may last over 3 to 5 minutes. Or the individual may have developed breathing difficulties or severe injuries. If a pregnant patient or a person with diabetes develops a seizure, it is prudent to call an ambulance. - Finally, if the patient does not regain full consciousness, then call EMS. Most patients will remain confused for about 45 to 90 minutes after a seizure, so you must use judgment in calling for help.     After the Seizure (Postictal Stage)   After a seizure, most patients experience confusion, fatigue, muscle pain and/or a headache. Thus, one should permit the individual to sleep. For the next few days, reassurance is essential. Being calm and helping reorient the person is also of importance.   Most seizures are painless and end spontaneously. Seizures are not harmful to others but can lead to complications such as stress on the lungs, brain and the heart. Individuals with prior lung problems may develop labored breathing and respiratory distress.        I personally spent a total of 38 minutes in the care of the patient today including getting/reviewing separately obtained history, performing a medically appropriate exam/evaluation, counseling and educating, placing orders, referring and communicating with other health care professionals, documenting clinical information in the EHR, independently  interpreting results, and coordinating care.      Arlin Krebs Epilepsy [facility] For questions after 5pm please refer to AMION to reach the Neurologist on call

## 2024-08-14 NOTE — ED Notes (Signed)
 ..  EMTALA: REQUIRED DOCUMENTATION COMPLETED AND REVIEWED BY WRITER PRIOR TO PT TRANSFER MD REASSESSMENT EMTALA RN SECTION TRANSFER E-SIGN VS WITHIN REQUIRED TIME

## 2024-08-14 NOTE — Discharge Instructions (Addendum)
 In a time of Crisis: Integrated family Services/Therapeutic Alternatives.  Mobile Crisis Management provides immediate crisis response, 24/7.  Call [phone] Depending on the county. [facility] for MH/DD/[facility] is available 24 hours a day, 7 days a week. Customer Service Specialists will assist you to find a crisis provider that is well-matched with your needs. Your local number is: [phone]  [facility]al Health Urgent Care (BHUC) IOP, individual counseling, medication management [address] [place], [place] [address] [phone] Call for intake hours; Medicaid and Uninsured    Outpatient Providers  [facility] (ADS) Group and individual counseling. [address]  [place], [place] [address] [phone] [place]: [phone]  [place]: [phone] Medicaid and uninsured.   [facility] Offers IOP groups multiple times per week. [address] [place], [place] [address] [phone] Takes Medicaid and other insurances.   [facility]  Chemical Dependency Intensive Outpatient Program (IOP) [address] [place], [place] [address] [phone] Takes nurse, learning disability and [place].   [facility]  IOP and Partial Hospitalization Program  [address]  [place], [place] [address] [phone] Private Insurance, [place] only for partial hospitalization  [facility] [place] [address], [address], [place], [place] [address] [phone] Monday-Friday. Short and Long term options. [facility]gent Care (BHUC) IOP, individual counseling, medication management [address] [place], [place] [address] [phone] Medicaid and [facility]  [facility] [address]  [place], [place] [address] [phone] Private Insurance and Self Pay   [facility] Outpatient [address]  [place], [place] [address] [phone] Private Insurance, [place], and Self Pay   [facility]: Methadone Clinic  [address] [place], [place] [address] [facility]  [address]  [place], [place] [address] [phone]  [facility]  [address] [place], [place] [address] [phone]  [facility]  Locations in [place], [place], [place], [place].  [phone] Accepts all insurances and some uninsured.   Residential Treatment Programs  [facility] ([facility] [address] [place], [place] [address] [phone] or [phone] Detox and Residential Rehab 21 days (Medicaid, private insurance, and self pay. If Medicare, will look into funding). No methadone. Call for pre-screen.   [facility]) [address]  [place], [place] [address] [phone] Detox 3-7 days (self Pay and Medicaid Limited). Transitional Program for females needs 60 days clean first.  Rehab Only for Males 60 days (Medicare, and Self Pay)-No methadone.  [facility] [address] [place], [place] [address] [phone] or [phone] Private Insurance only  [facility] PHONE: [phone] FAX: [phone] Residential program for women 21 and over for up to a year through a Christian 12-step recovery model. Self-pay.    [facility] [address] [place], [place] [address] Phone:  [phone] Must be detoxed 72 hours prior to admission; 28 day program.  Self-pay.  [facility] [address]  [place], [place] [phone] [employer], Medicare, [place] (not straight [place]). They offer assistance with transportation.   [facility] [address] [place],  [place], [place] [address] [phone] Christian Based Program. Men only. No insurance  [facility] [address]  [place], [place] [address] Women's: [phone] Men's: [phone] No Medicaid.   [facility]  [address]  [place], [place] [address] [phone] Treatment Only, must make assessment appointment, and must be sober for assessment appointment. Self pay, [facility], must be [facility] resident. No methadone.   [facility]  [address] [place], [place] [address] [phone] No pending legal charges, Long-term work program. No methadone. Call for assessment.  [facility]  [address] [place], [place] [address] [phone] or [phone] Commercial Insurance Only  [facility] - [phone] Private Insurance (no [place]). Males/Females, call to make referrals, multiple facilities.   [facility] [address]  [place], [place] [address]  [phone] Men Only Upfront Fee  [facility]-no methadone: [facility] [address] [place], [place] [address] [phone] (f)  [facility] Locations across the [place] (mainly [place] ) willing to help with transportation.  [phone] [employer].     AA Meetings Meeting Locator:  [email or web address] Also can download an app on that website.  Syringe Services Program Due to COVID-19, syringe services programs are likely operating under different hours with limited or no fixed site hours. Some programs may not be operating at all. Please contact the program directly using the phone numbers provided below to see if they are still operating under COVID-19. [facility] Solution to the Opioid Problem (GCSTOP) Fixed; mobile; peer-based Norman Herald [phone] [email or web address] Fixed site exchange at [facility], [address] [place], [place] [address] on Wednesdays (2:00 - 5:00 pm) and Thursdays (4:00 -  8:00 pm). Pop-up mobile exchange locations: [employer] and [employer], [address] [place], [place] [address] on Tuesdays (11:00 am - 1:00 pm) and Fridays (11:00 am - 1:00 pm) -[facility] - [address], [place], [place] [address] on Tuesdays (2:00 - 4:00 pm) and Fridays (2:00 - 4:00 pm) -[facility] -Fixed; mobile; peer-based [phone] [address] [place], [place] [address] Delivery and outreach available in [place] and [place], please call for more information. Monday, Tuesday: 1:00 -7:00 pm, Thursday: 4:00 pm - 8:00 pm, Friday: 1:00 pm - 8:00 pm) Medication-Assisted Treatment (MAT) -[facility]: Locations in [place], [place], [place], [place]. Accepts all insurances and some uninsured. Methadone, Suboxone, etc. Closed on weekends. Walk ins before 11am.  Call [phone]  -[facility]- services [place] and surrounding areas including [place], [place], [place], [place], [address], [address], [place], [place], [place], and [place], [place]. Options include Methadone, buprenorphine or Suboxone. [address], [place], [place] [address] Phone: [phone] [place] - Fri: 5:30am - 2:00pm, Sat: 5:30am -7:30am, Sun: Closed  -[facility]- We use FDA-approved medications, like methadone/suboxone/sublocade, and vivitrol. These medications are then combined with customized care plans that include individual or group counseling, toxicology, and medical care directed by on-site physicians. Accepts most insurance plans, Medicaid, and private pay.  [address] [place], [place] [address] Phone: [phone] Monday-Friday 5:00 AM - 10:00 AM, Saturday 6:00 AM - 8:30 AM, Sunday 6:00 AM - 7:00 AM  -[facility]- ADS is a treatment & recovery focused program. In addition to receiving methadone medication, our clients participate in individual and group counseling as well as random drug  testing. If accepted into the [facility]S Opioid Program, you will be provided several intake appointments and a physical exam [address] [place], [place] [address] Office: [phone]  Fax: [phone]  -[facility]- We put our community members at the  center of everything we do, for remote treatment services as well as in-person, from alcohol withdrawal to opioid use and more.  [address], [place], [place] [address] [phone] Monday-Wednesday: 9:00am - 5:00pm, Thursday: 9:00am - 6:00pm, Friday: 9:00am - 5:00pm Saturday: 9:00am - 1:00pm, Sunday: Closed  -[facility]- Our clinic in [place], a medication unit, offers daily dosing of methadone or buprenorphine in addition to our clinic in [place] offering counseling to help people overcome addiction to heroin and other opiates. We also offer psychiatric services including medication management, and an office based suboxone program. [address], [place], [place] [address] Phone [phone]  [facility]-We treat Opioid Addiction using medications that are a combination of buprenorphine and naloxone, which are used to treat adults addicted to narcotic painkillers and drugs such as heroin. They reduce the intense cravings and painful symptoms that accompany withdrawal. [address], [place], [place] Phone: [phone]  -[facility] ([place]): $12/daily for Methadone Treatment.  [address], [place], [place] [address] [phone] [address] [place], [place] [address]  -RTS: Suboxone only.  [address] [place], [place] [address] P. [phone]  -[facility] [address], [place], [place] [address] P. [phone]   [facility] assistance programs Crisis assistance programs  -[facility] Coordinated Entry Program. If you are experiencing homelessness in [place],  [place] , your first point of contact should be Pensions Consultant. You can reach Coordinated Entry by calling [phone] or by emailing [email or web address] [email or web address].  Community access points: [employer] [phone] [address], [place]) every Tuesday from 9am-10am. [facility] ([address] [place] [place]) every Wednesday from 8am-9am.   -[place] Coordinated Re-entry Daniel [place]: Dial 211 and request. Offers referrals to homeless shelters in the area.    -The [employer] ([phone]) offers several services to local families, as funding allows. The Emergency Assistance Program (EAP), which they administer, provides household goods, free food, clothing, and financial aid to people in need in the [place]  area. The EAP program does have some qualification, and counselors will interview clients for financial assistance by written referral only. Referrals need to be made by the Department of Social Services or by other EAP approved human services agencies or charities in the area.  -[facility] of [employer], which can be reached at [phone], offers emergency assistance programs for those in need of help, such as food, rent assistance, a soup kitchen, shelter, and clothing. They are based in [facility]  but provide a number of services to those that qualify for assistance.   [facility] Department of Social Services may be able to offer temporary financial assistance and cash grants for paying rent and utilities, Help may be provided for local county residents who may be experiencing personal crisis when other resources, including government programs, are not available. Call [phone]  -[facility] [employer] is a [employer] agency, The organization can offer emergency assistance for paying rent, caremark rx, utilities, food, household products and furniture. They offer extensive  emergency and transitional housing for families, children and single women, and also run a Boy's and D[employer]. Thrift Shops, Secondary School Teacher, and other aid offered too. [address] [place], [place]  [address], [phone]  -[place] Low Income Energy Assistance Program -- This is offered for [facility] families. The federal government created Low  Income Energy Assistance Program provides a one-time cash grant payment to help eligible low-income families pay their electric and heating bills. [address] [place], [place]  [address], [phone]  -[place] Emergency Assistance -- A program offers emergency utility and rent funds for greater [place] area residents. The program can also provide counseling and referrals to charities and government programs. Also provides food and a free meal program that serves lunch Mondays - Saturdays and dinner seven days per week to individuals in the community. [address], [place], [place]  [address], [phone]  -[employer] - Offers affordable apartment and housing communities across      [place] and [place]. The low income and seniors can access public housing, rental assistance to qualified applicants, and apply for the section 8 rent subsidy program. Other programs include Chiropractor and Engineer, Maintenance. [address] [place], [place]  [address], dial [phone].  -[facility] provides transitional housing to veterans and the disabled. Clients will also access other services too, including assistance in applying for Disability, life skills classes, case management, and assistance in finding permanent housing. [address], [place], [place]  [address], call [phone]  -[facility] Transitional Housing through [employer] is for people who were just evicted or that are formerly  homeless. The non-profit will also help then gain self-sufficiency, find a home or apartment to live in, and also provides information on rent assistance when needed. Phone [phone]  -[facility] helps low income, elderly, or disabled residents in seven counties in the [place] ([place], [place], [place], [place], [place], [place], [place], and [place]) save energy and reduce their utility bills by improving energy efficiency. Phone [phone].  -[employer] is located in the [place] [facility] in the [employer], [address], [place], [place] [address]. Parking is in the rear of the building. Phone: [phone]   General Email: info@gsohc .org  GHC provides free housing counseling assistance in locating affordable rental housing or housing with support services for families and individuals in crisis and the chronically homeless. We provide potential resources for other housing needs like utilities. Our trained counselors also work with clients on budgeting and financial literacy in effort to empower them to take control of their financial situations. [employer] collaborates with homeless service providers and other stakeholders as part of the [employer] COC (Continuum of Care). The (COC) is a regional/local planning body that coordinates housing and services funding for homeless families and individuals. The role of [facility] in the COC is through housing counseling to work with people we serve on diversion strategies for those that are at imminent risk of becoming homeless. We also work with the Coordinated Assessment/Entry Specialist who attempts to find temporary solutions and/or connects the people to Housing First, Rapid Re-housing or transitional housing programs. Our Homelessness Prevention Housing Counselors meet with clients on business days (Monday-Fridays,  except scheduled holidays) from 8:30 am to 4:30 pm.  Legal assistance for evictions, foreclosure, and more -If you need free legal advice on civil issues, such as foreclosures, evictions, electronics engineer, government programs, domestic issues and more, Landscape Architect of [place]  [facility]) is a associate professor firm that provides free legal services and counsel to lower income people, seniors, disabled, and others, The goal is to ensure everyone has access to justice and fair representation. Call them at ([phone].  [facility] for Housing  and Community Studies can provide info about obtaining legal assistance with evictions. Phone [phone].  Data Processing Manager  The [employer], [employer]. offers job and dispensing optician. Resources are focused on helping students obtain the skills and experiences that are necessary to compete in today's challenging and tight job market. The non-profit faith-based community action agency offers internship trainings as well as classroom instruction. Classes are tailored to meet the needs of people in the [facility] region. [place], [place] [address], [phone]  Foreclosure prevention/Debt Services Family Services of the [employer] Credit Counseling Service inludes debt and foreclosure prevention programs for local families. This includes money management, financial advice, budget review and development of a written action plan with a pensions consultant to help solve specific individual financial problems. In addition, housing and mortgage counselors can also provide pre- and post-purchase homeownership counseling, default resolution counseling (to prevent foreclosure) and reverse mortgage counseling. A Debt Management Program allows people and families with a high level of credit card or medical debt to consolidate and repay consumer debt and loans to creditors and rebuild positive credit ratings  and scores. Contact [phone] [phone].  Community clinics in [place] -Health Department [facility] Clinic: [address] [place], [place], [address]. [phone].  -Health Department [place] Clinic: [address], [facility], [address]. [phone].  -[facility] offers medical care through a group of doctors, pharmacies and other healthcare related agencies that offer services for low income, uninsured adults in [place]. Also offers adult Dental care and assistance with applying for an [employer]. Call [phone].   [facility]. This center provides low-cost health care to those without health insurance. Services offered include an onsite pharmacy. Phone [phone]. [address], [place].  -Medication Assistance Program serves as a link between pharmaceutical companies and patients to provide low cost or free prescription medications. This service is available for residents who meet certain income restrictions and have no insurance coverage. PLEASE CALL [phone] [place]) OR [phone] ([place])  -One Step Further: Materials Engineer, The [employer] Support & Nutrition Program, [place]. Call [phone]/ [phone].  Food pantry and assistance -Urban Ministry-Food Bank: [address], [place] [address]. Phone [phone]  -[facility] Food Pantry: [address], [place], [place] [address]. [phone].  -Missionary Ministry: has the purpose of visiting the sick and shut-ins and provide for needs in the surrounding communities. Call [phone]. Email: [email or web address] .com This program provides: Food box for seniors, Financial assistance, Food to meet basic nutritional needs.  -Meals on Wheels with Senior Resources: [facility] residents age 65 and over who are homebound and unable to obtain and prepare a nutritious meal for  themselves are eligible for this service. There may be a waiting list in certain parts of [facility] if the route in that area is full. If you are in [facility] and [place] call [phone] to register. For all other areas call [phone] to register.  -Greater Dietitian: https://findfood.bargaincontractor.si  TRANSPORTATION: -[employer] Department of Health: Call [facility] and [employer] at [phone] for details. attractionguides.es  -Access [place]: Access [place] is the [employer] Agency's shared-ride transportation service for eligible riders who have a disability that prevents them from riding the fixed route bus. Call [phone]. Access [place] riders must pay a fare of $1.50 per trip, or may purchase a 10-ride punch card  for $14.00 ($1.40 per ride) or a 40-ride punch card for $48.00 ($1.20 per ride).  -The Shepherd's WHEELS rideshare transportation service is provided for senior citizens (60+) who live independently within [place] city limits and are unable to drive or have limited access to transportation. Call [phone] to schedule an appointment.  -[place] Transportation: For Medicare or Medicaid recipients call 562-536-9680?SABRA Ambulance, wheelchair fleeta, and ambulatory quotes available.   FLEEING VIOLENCE: -Family Services of the [facility]- 24/7 Crisis line [phone]) -[facility] Justice Centers: (336) 641-[phone] [phone])  Nice 2-1-1 is another useful way to locate resources in the community. Visit shedsizes.ch to find service information online. If you need additional assistance, 2-1-1 Referral Specialists are available 24 hours a day, every day by dialing 2-1-1 or [phone] from any phone. The call is free, confidential, and available in any language.  Affordable Housing Search http://www.nchousingsearch.[facility])   M-F 8a-3p [address]  [place], [place] [address] [phone] Services include: laundry, barbering, support groups, case management, phone & computer access, showers, AA/NA mtgs, mental health/substance abuse nurse, job skills class, disability information, VA assistance, spiritual classes, etc. Winter Shelter available when temperatures are less than 32 degrees.   HOMELESS SHELTERS [facility] Night [facility] at [facility]- Call [phone] ext. [phone] or ext. [phone]. Located at [address], [place], [place] [address]  [facility] Mens Shelter- Call [phone]. Located at [address], [place] [address].  [facility]- [employer]. Call [phone]. Office located at [address], [place] [address].  [facility] Family Housing through [place] [phone].  [facility] Family Shelter- Call [phone]. Located at [address] [place], [place], [place] [address].  Room at the Inn-For Pregnant mothers. Call [phone]. Located at [address] [place], [address].  [facility]-For men in [place]. Call [phone]. [address]-Shelter in [place]. Call [phone].  Home of [employer] for [employer] ([phone]. Office located at [address] [place], [address].  [employer] be agreeable to help with chores. Call [phone] ext. [phone].  Men's: [address] [place], [place] [address]. Women's: [facility]  [address] [place], [place] [address]  Crisis Services [facility]atives Mobile Crisis Management- [phone]  [facility] [address], [place], [place] [address]. Phone: [phone]  Do not drive, operate heavy machinery, perform activities at heights, swimming or participation in water activities or provide baby sitting services until you have seen by Primary MD or a  Neurologist and advised to do so again.  Follow with Primary MD  in 7 days   Get CBC, CMP, Magnesium, 2 view Chest X ray -  checked next visit with your primary MD    Activity: As tolerated with Full fall precautions use walker/cane & assistance as needed  Disposition inpatient stay  Diet: Heart Healthy   Special Instructions: If you have smoked or chewed Tobacco  in the last 2 yrs please stop smoking, stop any regular Alcohol  and or any Recreational drug use.  On your next visit with your primary care physician please Get Medicines reviewed and adjusted.  Please request your Prim.MD to go over all Hospital Tests and Procedure/Radiological results at the follow up, please get all Hospital records sent to your Prim MD by signing hospital release before you go home.  If you experience worsening of your admission symptoms, develop shortness  of breath, life threatening emergency, suicidal or homicidal thoughts you must seek medical attention immediately by calling 911 or calling your MD immediately  if symptoms less severe.  You Must read complete instructions/literature along with all the possible adverse reactions/side effects for all the Medicines you take and that have been prescribed to you. Take any new Medicines after you have completely understood and accpet all the possible adverse reactions/side effects.   Do not drive when taking Pain medications.  Do not take more than prescribed Pain, Sleep and Anxiety Medications  Wear Seat belts while driving.

## 2024-08-14 NOTE — Assessment & Plan Note (Signed)
 No further seizure-like activity noted, initial EEG was negative for any seizures.  Prolonged EEG results are pending. - Continue with seizure precautions -Follow-up neurology recommendations

## 2024-08-14 NOTE — Plan of Care (Signed)
  Problem: Clinical Measurements: Goal: Ability to maintain clinical measurements within normal limits will improve Outcome: Progressing Goal: Will remain free from infection Outcome: Progressing Goal: Diagnostic test results will improve Outcome: Progressing Goal: Respiratory complications will improve Outcome: Progressing Goal: Cardiovascular complication will be avoided Outcome: Progressing   Problem: Activity: Goal: Risk for activity intolerance will decrease Outcome: Progressing   Problem: Elimination: Goal: Will not experience complications related to bowel motility Outcome: Progressing Goal: Will not experience complications related to urinary retention Outcome: Progressing

## 2024-08-14 NOTE — TOC Initial Note (Signed)
 Transition of Care [facility]) - Initial/Assessment Note    Patient Details  Name: Seth Luna MRN: [id number] Date of Birth: 1996/01/20  [facility] of [facility] CM/SW Contact:    Inocente GORMAN Kindle, LCSW Phone Number: 08/14/2024, 3:25 PM  Clinical Narrative:                 CSW following for assistance with inpatient psych placement once medically cleared. Patient was IVC'd on 08/13/24.  Expected Discharge Plan: Psychiatric Hospital Barriers to Discharge: Continued Medical Work up   Patient Goals and CMS Choice Patient states their goals for this hospitalization and ongoing recovery are:: Stabilization          Expected Discharge Plan and Services In-house Referral: Clinical Social Work     Living arrangements for the past 2 months: Single Family Home                                      Prior Living Arrangements/Services Living arrangements for the past 2 months: Single Family Home   Patient language and need for interpreter reviewed:: Yes        Need for Family Participation in Patient Care: Yes (Comment) Care giver support system in place?: Yes (comment)   Criminal Activity/Legal Involvement Pertinent to Current Situation/Hospitalization: No - Comment as needed  Activities of Daily Living   ADL Screening (condition at time of admission) Independently performs ADLs?: Yes (appropriate for developmental age) Is the patient deaf or have difficulty hearing?: No Does the patient have difficulty seeing, even when wearing glasses/contacts?: No Does the patient have difficulty concentrating, remembering, or making decisions?: No  Permission Sought/Granted                  Emotional Assessment Appearance:: Appears stated age     Orientation: : Oriented to Self, Oriented to Place, Oriented to  Time, Oriented to Situation Alcohol / Substance Use: Alcohol Use Psych Involvement: Yes (comment)  Admission diagnosis:  Overdose [T50.901A] Seizures (HCC)  [R56.9] Seizure-like activity (HCC) [R56.9] Tricyclic antidepressant overdose of undetermined intent, initial encounter [T43.014A] Patient Active Problem List   Diagnosis Date Noted   Alcohol use 08/14/2024   Alcohol intoxication delirium with moderate or severe use disorder (HCC) 08/14/2024   ADHD (attention deficit hyperactivity disorder)    Seizure-like activity (HCC) 08/13/2024   Tricyclic overdose 08/13/2024   Seizure (HCC) 08/12/2024   Intentional drug overdose (HCC) 04/20/2022   Moderate recurrent major depression (HCC) 04/20/2022   Acute respiratory failure (HCC)    Intentional overdose (HCC)    Dystonic drug reaction 02/22/2019   Acute encephalopathy 02/22/2019   Substance abuse (HCC) 02/22/2019   PCP:  Patient, No Pcp Per Pharmacy:   [id number] - [place], [place] - [address] [place] [address] Phone: [phone] Fax: [phone]  [facility] [address] [place] [address] Phone: [phone] Fax: [phone]     Social Drivers of Health (SDOH) Social History: SDOH Screenings   Food Insecurity: Food Insecurity Present (08/13/2024)  Housing: High Risk (08/13/2024)  Transportation Needs: Patient Unable To Answer (08/13/2024)  Utilities: At Risk (08/13/2024)  Tobacco Use: High Risk (08/13/2024)   SDOH Interventions: Food Insecurity Interventions: [employer] Provided Housing Interventions: [employer] Provided Utilities Interventions: [employer] Provided   Readmission Risk Interventions     No data to display

## 2024-08-14 NOTE — Assessment & Plan Note (Signed)
 No further seizure-like activity noted, initial EEG was negative for any seizures.  Prolonged EEG results are pending. - Continue with seizure precautions -Follow-up neurology recommendations, neurology decided to start him on Vimpat because of his history of having seizures before.  He will follow-up with them as outpatient for further assistance.

## 2024-08-14 NOTE — Assessment & Plan Note (Signed)
 Apparently he was drinking excessively as he was upset. -Continue with supplement and CIWA protocol
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
## 2024-08-14 NOTE — Hospital Course (Addendum)
 Taken from prior notes.  Seth Luna is a 28 y.o. male with medical history significant of ADHD, depression and history of substance abuse.  Patient presented to the ED on 10/21 via EMS after having seizure at home that lasted 3 to 5 minutes apparently has a history of seizures 5 years ago but not on AEDs.   Per report patient was drinking heavily and also took multiple pills as he was upset after breaking up with his girlfriend.  Unknown if this was an intentional suicidal attempt.Patient was initially in the [facility] ED where he was IVC 8. CT head, cervical spine and other trauma workup was negative. He was transferred to [facility] ED for LTM EEG.  Patient was seen by psychiatry but unable to evaluate as he remained heavily sedated.  UDS positive amphetamine and tricyclic's.  Labs mostly unremarkable except mildly elevated ammonia at 57.  10/23: Vital stable.  B12 at 229 with goal is above 400, starting on supplement. More alert and interactive and denying any active suicidal thoughts, pending formal psych evaluation now.  Starting on diet.  10/24: Remained hemodynamically stable.  Based on his prior multiple suicide attempts psych is recommending inpatient admission.  Neurology started him on Vimpat as he has an history of multiple prior seizures, likely due to alcohol withdrawal but will need an outpatient evaluation by neurology.  A referral was provided.  Patient was counseled again and agreed to go to behavioral health.  Patient is being discharged to behavioral health for further management of his suicidal thoughts.  He was also started on Prozac .  Psych.  He will continue on current medications and follow-up with his providers for further assistance.  Patient was also started on some supplements which he will continue on discharge.  Patient should not be driving until cleared by outpatient neurology.

## 2024-08-14 NOTE — Progress Notes (Addendum)
  Progress Note   Patient: Seth Luna FMW:[id number] DOB: 17-Jul-1996 DOA: 08/13/2024     1 DOS: the patient was seen and examined on 08/14/2024   Brief hospital course: Taken from prior notes.  Seth Luna is a 28 y.o. male with medical history significant of ADHD, depression and history of substance abuse.  Patient presented to the ED on 10/21 via EMS after having seizure at home that lasted 3 to 5 minutes apparently has a history of seizures 5 years ago but not on AEDs.   Per report patient was drinking heavily and also took multiple pills as he was upset after breaking up with his girlfriend.  Unknown if this was an intentional suicidal attempt.Patient was initially in the [facility] ED where he was IVC 8. CT head, cervical spine and other trauma workup was negative. He was transferred to [facility] ED for LTM EEG.  Patient was seen by psychiatry but unable to evaluate as he remained heavily sedated.  UDS positive amphetamine and tricyclic's.  Labs mostly unremarkable except mildly elevated ammonia at 57.  10/23: Vital stable.  B12 at 229 with goal is above 400, starting on supplement. More alert and interactive and denying any active suicidal thoughts, pending formal psych evaluation now.  Starting on diet  Assessment and Plan: * Seizure-like activity (HCC) No further seizure-like activity noted, initial EEG was negative for any seizures.  Prolonged EEG results are pending. - Continue with seizure precautions -Follow-up neurology recommendations, neurology decided to start him on Vimpat because of his history of having seizures before.  He will follow-up with them as outpatient for further assistance.  TCA (tricyclic antidepressant) overdose of undetermined intent Patient is more awake and interactive, stating that he was just upset with no suicidal intention.  Last QTc of 499 and no chest pain.  UDS positive for amphetamines and tricyclic's. -Pending formal psych  evaluation  Alcohol use Apparently he was drinking excessively as he was upset. -Continue with supplement and CIWA protocol  ADHD (attention deficit hyperactivity disorder) History of ADHD, apparently was not on any medications.   Subjective: Patient was more awake and interactive when seen today.  Denies any active suicidal thoughts, stating that he was just upset.  Physical Exam: Vitals:   08/14/24 0000 08/14/24 0451 08/14/24 0518 08/14/24 0830  BP: (!) 142/100 (!) 142/112 120/85 (!) 136/90  Pulse: 93 92 (!) 101 98  Resp: 18 18  18   Temp: 97.7 F (36.5 C) 98.6 F (37 C)    TempSrc: Oral Oral    SpO2:    98%   General.  Well-developed gentleman, in no acute distress. Pulmonary.  Lungs clear bilaterally, normal respiratory effort. CV.  Regular rate and rhythm, no JVD, rub or murmur. Abdomen.  Soft, nontender, nondistended, BS positive. CNS.  Alert and oriented .  No focal neurologic deficit. Extremities.  No edema, no cyanosis, pulses intact and symmetrical. Psychiatry.  Judgment and insight appears normal.   Data Reviewed: Prior data reviewed  Family Communication: Discussed with patient  Disposition: Status is: Inpatient Remains inpatient appropriate because: Severity of illness  Planned Discharge Destination: Home  DVT prophylaxis.  Lovenox  Time spent: 45 minutes  This record has been created using Conservation officer, historic buildings. Errors have been sought and corrected,but may not always be located. Such creation errors do not reflect on the standard of care.   Author: Amaryllis Dare, MD 08/14/2024 11:25 AM  For on call review [email or web address].

## 2024-08-14 NOTE — Assessment & Plan Note (Signed)
 History of ADHD, apparently was not on any medications.

## 2024-08-15 DIAGNOSIS — T43012A Poisoning by tricyclic antidepressants, intentional self-harm, initial encounter: Secondary | ICD-10-CM | POA: Diagnosis not present

## 2024-08-15 DIAGNOSIS — T43014A Poisoning by tricyclic antidepressants, undetermined, initial encounter: Secondary | ICD-10-CM | POA: Diagnosis not present

## 2024-08-15 DIAGNOSIS — F909 Attention-deficit hyperactivity disorder, unspecified type: Secondary | ICD-10-CM | POA: Diagnosis not present

## 2024-08-15 DIAGNOSIS — R569 Unspecified convulsions: Secondary | ICD-10-CM | POA: Diagnosis not present

## 2024-08-15 DIAGNOSIS — F10221 Alcohol dependence with intoxication delirium: Secondary | ICD-10-CM

## 2024-08-15 LAB — GLUCOSE, CAPILLARY
Glucose-Capillary: 74 mg/dL (ref 70–99)
Glucose-Capillary: 103 mg/dL — ABNORMAL HIGH (ref 70–99)
Glucose-Capillary: 94 mg/dL (ref 70–99)

## 2024-08-15 MED ORDER — ADULT MULTIVITAMIN W/MINERALS CH: 1.0000 | ORAL_TABLET | Freq: Every day | ORAL | 1 refills | Status: AC

## 2024-08-15 MED ORDER — LACOSAMIDE 50 MG PO TABS: 50.0000 mg | ORAL_TABLET | Freq: Two times a day (BID) | ORAL | 1 refills | Status: AC

## 2024-08-15 MED ORDER — FOLIC ACID 1 MG PO TABS: 1.0000 mg | ORAL_TABLET | Freq: Every day | ORAL | 1 refills | Status: AC

## 2024-08-15 MED ORDER — VITAMIN B-1 100 MG PO TABS: 100.0000 mg | ORAL_TABLET | Freq: Every day | ORAL | 1 refills | Status: AC

## 2024-08-15 MED ORDER — FLUOXETINE HCL 20 MG PO CAPS: 20.0000 mg | ORAL_CAPSULE | Freq: Every day | ORAL | 3 refills | Status: AC

## 2024-08-15 MED ORDER — METOPROLOL TARTRATE 12.5 MG HALF TABLET
12.5000 mg | ORAL_TABLET | Freq: Two times a day (BID) | ORAL | Status: DC
  Administered 2024-08-15 (×2): 12.5 mg via ORAL
  Filled 2024-08-15 (×2): qty 1

## 2024-08-15 MED ORDER — CYANOCOBALAMIN 1000 MCG PO TABS: 1000.0000 ug | ORAL_TABLET | Freq: Every day | ORAL | 1 refills | Status: AC

## 2024-08-15 NOTE — TOC Progression Note (Addendum)
 Transition of Care [facility]) - Progression Note    Patient Details  Name: Seth Luna MRN: [id number] Date of Birth: 17-Feb-1996  [facility]) CM/SW Contact  Inocente GORMAN Kindle, LCSW Phone Number: 08/15/2024, 8:43 AM  Clinical Narrative:    8:45am-Per [facility], no beds available today. CSW sent referral to the following:  Service Provider Address  [facility]  [place] [address]  [facility]  [address] [facility] [place] [address]  [facility]  [phone] [place] [address]  [facility]  [address] [place] [address]  [facility]  [address], [place] [address]  [facility]  [address], [place] [address]  [facility]  [address] [place] [address]  [facility]  [address], [place]  [facility]  [address] [place] [address]   1:50 AM-CSW received call from [facility]. CSW provided RN contact info so they can determine if they can accept patient for today.    11:21 AM-[facility] able to accept patient today. Report#: [phone] Accepting MD: Dr. Lois Ilsa Hurst: [name]  CSW faxed IVC to [phone]. [phone] as requested.   Expected Discharge Plan: Psychiatric Hospital Barriers to Discharge: Psych Bed not available               Expected Discharge Plan and Services In-house Referral: Clinical Social Work     Living arrangements for the past 2 months: Single Family Home                                       Social Drivers of Health (SDOH) Interventions SDOH Screenings   Food Insecurity: Food Insecurity Present (08/13/2024)  Housing: High Risk (08/14/2024)  Transportation Needs: Patient Unable To Answer (08/13/2024)  Utilities: At Risk (08/13/2024)  Tobacco Use: High Risk (08/13/2024)     Readmission Risk Interventions     No data to display

## 2024-08-15 NOTE — Discharge Summary (Signed)
 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  Seth Luna is a 28 y.o. male with medical history significant of ADHD, depression and history of substance abuse.  Patient presented to the ED on 10/21 via EMS after having seizure at home that lasted 3 to 5 minutes apparently has a history of seizures 5 years ago but not on AEDs.   Per report patient was drinking heavily and also took multiple pills as he was upset after breaking up with his girlfriend.  Unknown if this was an intentional suicidal attempt.Patient was initially in the [facility] ED where he was IVC 8. CT head, cervical spine and other trauma workup was negative. He was transferred to [facility] ED for LTM EEG.  Patient was seen by psychiatry but unable to evaluate as he remained heavily sedated.  UDS positive amphetamine and tricyclic's.  Labs mostly unremarkable except mildly elevated ammonia at 57.  10/23: Vital stable.  B12 at 229 with goal is above 400, starting on supplement. More alert and interactive and denying any active suicidal thoughts, pending formal psych evaluation now.  Starting on diet.  10/24: Remained hemodynamically stable.  Based on his prior multiple suicide attempts psych is recommending inpatient admission.  Neurology started him on Vimpat as he has an history of multiple prior seizures, likely due to alcohol withdrawal but will need  an outpatient evaluation by neurology.  A referral was provided.  Patient was counseled again and agreed to go to behavioral health.  Patient is being discharged to behavioral health for further management of his suicidal thoughts.  He was also started on Prozac .  Psych.  He will continue on current medications and follow-up with his providers for further assistance.  Patient was also started on some supplements which he will continue on discharge.  Patient should not be driving until cleared by outpatient neurology.  Assessment and Plan: * Seizure-like activity (HCC) No further seizure-like activity noted, initial EEG was negative for any seizures.  Prolonged EEG results are pending. - Continue with seizure precautions -Follow-up neurology recommendations, neurology decided to start him on Vimpat because of his history of having seizures before.  He will follow-up with them as outpatient for further assistance.  Tricyclic overdose Patient is more awake and interactive, stating that he was just upset with no suicidal intention.  Last QTc of 499 and no chest pain.  UDS positive for amphetamines and tricyclic's. - Patient has similar attempts in the past so psych is recommending inpatient behavioral health services where he is being discharged.  Alcohol use Apparently he was drinking excessively as he was upset. -Continue with supplement and CIWA protocol  ADHD (attention deficit hyperactivity disorder) History of ADHD, apparently was not on any medications.   Consultants: Psychiatry.  Neurology Procedures performed:  EEG Disposition: Behavioral health Diet recommendation:  Discharge Diet Orders (From admission, onward)     Start     Ordered   08/15/24  0000  Diet - low sodium heart healthy        08/15/24 1016           Regular diet DISCHARGE MEDICATION: Allergies as of 08/15/2024       Reactions   Haldol  [haloperidol  Lactate] Other (See Comments)   Dystonic reaction    Haloperidol  And Related         Medication List     STOP taking these medications    escitalopram  5 MG tablet Commonly known as: LEXAPRO    traZODone  100 MG tablet Commonly known as: DESYREL        TAKE these medications    cyanocobalamin 1000 MCG tablet Take 1 tablet (1,000 mcg total) by mouth daily. Start taking on: August 16, 2024   FLUoxetine  20 MG capsule Commonly known as: PROZAC  Take 1 capsule (20 mg total) by mouth daily. Start taking on: August 16, 2024   folic acid 1 MG tablet Commonly known as: FOLVITE Take 1 tablet (1 mg total) by mouth daily. Start taking on: August 16, 2024   lacosamide 50 MG Tabs tablet Commonly known as: VIMPAT Take 1 tablet (50 mg total) by mouth 2 (two) times daily.   Melatonin 3 MG Caps Take 1 capsule (3 mg total) by mouth at bedtime as needed.   multivitamin with minerals Tabs tablet Take 1 tablet by mouth daily. Start taking on: August 16, 2024   thiamine 100 MG tablet Commonly known as: Vitamin B-1 Take 1 tablet (100 mg total) by mouth daily. Start taking on: August 16, 2024        Discharge Exam: There were no vitals filed for this visit. General.  Well-developed gentleman, in no acute distress. Pulmonary.  Lungs clear bilaterally, normal respiratory effort. CV.  Regular rate and rhythm, no JVD, rub or murmur. Abdomen.  Soft, nontender, nondistended, BS positive. CNS.  Alert and oriented .  No focal neurologic deficit. Extremities.  No edema, no cyanosis, pulses intact and symmetrical. Psychiatry.  Judgment and insight appears normal.   Condition at discharge: stable  The results of significant diagnostics from this hospitalization (including imaging, microbiology, ancillary and laboratory) are listed below for reference.   Imaging Studies: MR BRAIN WO CONTRAST Result Date: 08/13/2024 EXAM: MRI BRAIN WITHOUT CONTRAST 08/13/2024 11:37:24 PM TECHNIQUE: Multiplanar multisequence MRI of the head/brain was  performed without the administration of intravenous contrast. COMPARISON: None available. CLINICAL HISTORY: Seizure, new-onset, no history of trauma. FINDINGS: BRAIN AND VENTRICLES: No acute infarct. No intracranial hemorrhage. No mass. No midline shift. No hydrocephalus. The sella is unremarkable. Normal flow voids. ORBITS: No acute abnormality. SINUSES AND MASTOIDS: No acute abnormality. BONES AND SOFT TISSUES: Normal marrow signal. No acute soft tissue abnormality. IMPRESSION: 1. Normal brain MRI.  No likely seizure etiology identified. Electronically signed by: Franky Stanford MD 08/13/2024 11:59 PM EDT RP Workstation: [id number]   Overnight EEG with video Result Date: 08/13/2024 Seth Seth KIDD, MD     08/14/2024 10:38 AM Patient Name: Seth Luna MRN: [id number] Epilepsy Attending: Arlin Luna Seth Referring Physician/Provider: Michaela Aisha SQUIBB, MD Duration: 08/13/2024 0511 to 2150 Patient history: 28 y.o. male with hx of single prior seizure 5 years ago without identifiable trigger per girlfriend, not on AEDs, who presented with AMS in the setting of drug overdose with 2 witnessed GTC seizures without return to baseline. EEG to evaluate for seizure Level of alertness: Awake, asleep AEDs during EEG study: None Technical aspects: This EEG study was done with scalp electrodes positioned according to  the 10-20 International system of electrode placement. Electrical activity was reviewed with band pass filter of 1-70Hz , sensitivity of 7 uV/mm, display speed of 58mm/sec with a 60Hz  notched filter applied as appropriate. EEG data were recorded continuously and digitally stored.  Video monitoring was available and reviewed as appropriate. Description: The posterior dominant rhythm consists of 8-9 Hz activity of moderate voltage (25-35 uV) seen predominantly in posterior head regions, symmetric and reactive to eye opening and eye closing. Sleep was characterized by vertex waves, sleep spindles (12 to 14  Hz), maximal frontocentral region. Hyperventilation and photic stimulation were not performed.   IMPRESSION: This study is within normal limits. No seizures or epileptiform discharges were seen throughout the recording. A normal interictal EEG does not exclude the diagnosis of epilepsy. Seth MALVA Krebs   DG Chest Portable 1 View Result Date: 08/12/2024 EXAM: 1 VIEW(S) XRAY OF THE CHEST 08/12/2024 04:02:00 PM COMPARISON: 01/04/2024 CLINICAL HISTORY: AMS. FINDINGS: LUNGS AND PLEURA: Mildly lower lung volumes. No focal pulmonary opacity. No pulmonary edema. No pleural effusion. No pneumothorax. HEART AND MEDIASTINUM: No acute abnormality of the cardiac and mediastinal silhouettes. BONES AND SOFT TISSUES: No acute osseous abnormality. IMPRESSION: 1. No acute cardiopulmonary process. Electronically signed by: Waddell Calk MD 08/12/2024 04:41 PM EDT RP Workstation: [id number]   CT Cervical Spine Wo Contrast Result Date: 08/12/2024 CLINICAL DATA:  Ataxia, cervical trauma Seizure this morning. EXAM: CT CERVICAL SPINE WITHOUT CONTRAST TECHNIQUE: Multidetector CT imaging of the cervical spine was performed without intravenous contrast. Multiplanar CT image reconstructions were also generated. RADIATION DOSE REDUCTION: This exam was performed according to the departmental dose-optimization program which includes automated exposure control, adjustment of the mA and/or kV according to patient size and/or use of iterative reconstruction technique. COMPARISON:  CT cervical spine 02/14/2014. FINDINGS: Alignment: Straightening without focal angulation or listhesis. Skull base and vertebrae: No evidence of acute cervical spine fracture or traumatic subluxation. Soft tissues and spinal canal: No prevertebral fluid or swelling. No visible canal hematoma. Disc levels: The cervical disc heights are maintained. No evidence of significant disc herniation, spinal stenosis or foraminal narrowing. Upper chest: Clear lung apices.  Other: None. IMPRESSION: 1. No evidence of acute cervical spine fracture, traumatic subluxation or static signs of instability. 2. Straightening of the cervical spine which may be positional or secondary to muscle spasm. Electronically Signed   By: Elsie Perone M.D.   On: 08/12/2024 14:45   CT HEAD WO CONTRAST ( ) Result Date: 08/12/2024 EXAM: CT HEAD WITHOUT CONTRAST 08/12/2024 02:31:14 PM TECHNIQUE: CT of the head was performed without the administration of intravenous contrast. Automated exposure control, iterative reconstruction, and/or weight based adjustment of the mA/kV was utilized to reduce the radiation dose to as low as reasonably achievable. COMPARISON: None available. CLINICAL HISTORY: Head trauma, abnormal mental status (Age 40-64y). Triage note: Pt BIB [facility] from Home for having a seizure this morning. EMS reports seizure was tonic - clonic lasting 3-5 min. Pt has hx of seizure 5 years ago, does not take any meds for it, has not had one since. EMS states pt started drinking a lot last night due to GF breaking up with him. EMS also reporting pt took 10-15 pills of nortriptyline around 0400 this morning, unknown if this was intentional or not. Medication bottle is not for the pt, unknown where pt received this medicine. Pt mentioned to people in house that he was outside to vomit, unsure if pt vomited meds or not. EMS states pt has been restless in postictal  state however not violent. FINDINGS: BRAIN AND VENTRICLES: No acute hemorrhage. No evidence of acute infarct. No hydrocephalus. No extra-axial collection. No mass effect or midline shift. ORBITS: No acute abnormality. SINUSES: No acute abnormality. SOFT TISSUES AND SKULL: No acute soft tissue abnormality. Remote nasal bone fracture. No skull fracture. IMPRESSION: 1. No acute intracranial abnormality. 2. Remote nasal bone fracture. Electronically signed by: Donnice Mania MD 08/12/2024 02:41 PM EDT RP Workstation: [id number]     Microbiology: Results for orders placed or performed during the hospital encounter of 04/20/22  MRSA Next Gen by PCR, Nasal     Status: None   Collection Time: 04/20/22  5:22 AM   Specimen: Nasal Mucosa; Nasal Swab  Result Value Ref Range Status   MRSA by PCR Next Gen NOT DETECTED NOT DETECTED Final    Comment: (NOTE) The GeneXpert MRSA Assay (FDA approved for NASAL specimens only), is one component of a comprehensive MRSA colonization surveillance program. It is not intended to diagnose MRSA infection nor to guide or monitor treatment for MRSA infections. Test performance is not FDA approved in patients less than 67 years old. Performed at [facility], [address] [place], [place] [address]     Labs: CBC: Recent Labs  Lab 08/12/24 1318 08/13/24 0440 08/14/24 0559  WBC 8.0 8.1 5.8  NEUTROABS 6.3  --   --   HGB 14.8 13.7 14.8  HCT 41.7 40.3 42.9  MCV 86.0 89.4 88.8  PLT 251 275 232   Basic Metabolic Panel: Recent Labs  Lab 08/12/24 1318 08/13/24 0440 08/14/24 0559  NA 140 138 140  K 4.1 4.0 3.8  CL 101 107 103  CO2 27 23 25   GLUCOSE 127* 87 109*  BUN 7 <5* <5*  CREATININE 0.66 0.72 0.96  CALCIUM 8.3* 8.1* 8.4*  MG 1.9 2.5*  --    Liver Function Tests: Recent Labs  Lab 08/12/24 1318 08/13/24 0440  AST 33 26  ALT 24 20  ALKPHOS 55 54  BILITOT 0.3 0.7  PROT 7.6 5.5*  ALBUMIN 3.8 3.1*   CBG: Recent Labs  Lab 08/14/24 0616 08/14/24 1430 08/14/24 2343 08/15/24 0531 08/15/24 0736  GLUCAP 147* 124* 155* 103* 94    Discharge time spent: greater than 30 minutes.  This record has been created using Conservation officer, historic buildings. Errors have been sought and corrected,but may not always be located. Such creation errors do not reflect on the standard of care.   Signed: Amaryllis Dare, MD [facility] 08/15/2024

## 2024-08-15 NOTE — TOC Progression Note (Signed)
 Transition of Care [facility]) - Progression Note    Patient Details  Name: Seth Luna MRN: [id number] Date of Birth: Oct 08, 1996  Transition [facility]) CM/SW Contact  Inocente GORMAN Kindle, LCSW Phone Number: 08/15/2024, 2:47 PM  Clinical Narrative:    DC cancelled per MD due to blood pressure. CSW updated Sheriff's Office who stated weekend can call them at [phone] and leave a message as they will check the voicemail in the morning as they are on call.   CSW spoke with [place] at [facility] who stated to have staff call report again tomorrow [phone]) and they will see if a new room will be assigned or if it will be the same.    Expected Discharge Plan: Psychiatric Hospital Barriers to Discharge: Continued Medical Work up               Expected Discharge Plan and Services In-house Referral: Clinical Social Work     Living arrangements for the past 2 months: Single Family Home Expected Discharge Date: 08/15/24                                     Social Drivers of Health (SDOH) Interventions SDOH Screenings   Food Insecurity: Food Insecurity Present (08/13/2024)  Housing: High Risk (08/14/2024)  Transportation Needs: Patient Unable To Answer (08/13/2024)  Utilities: At Risk (08/13/2024)  Tobacco Use: High Risk (08/13/2024)    Readmission Risk Interventions     No data to display

## 2024-08-15 NOTE — Plan of Care (Signed)

## 2024-08-15 NOTE — TOC Transition Note (Signed)
 Transition of Care [facility]) - Discharge Note   Patient Details  Name: Seth Luna MRN: [id number] Date of Birth: May 17, 1996  Transition of Care [facility]) CM/SW Contact:  Inocente GORMAN Kindle, LCSW Phone Number: 08/15/2024, 11:43 AM   Clinical Narrative:    Patient will DC to: [facility] Anticipated DC date: 08/15/24 Transport by: [place]   Per MD patient ready for DC to [facility]. RN to call report prior to discharge [phone]). RN, patient, patient's family, and facility notified of DC. IVC sent to facility as requested. DC packet on chart. Sheriff transport requested for patient and MD aware EMTALA is requested.   CSW will sign off for now as social work intervention is no longer needed. Please consult us  again if new needs arise.     Final next level of care: Psychiatric Hospital Barriers to Discharge: Barriers Resolved   Patient Goals and CMS Choice Patient states their goals for this hospitalization and ongoing recovery are:: Stabilization          Discharge Placement                Patient to be transferred to facility by: [facility]   Patient and family notified of of transfer: 08/15/24  Discharge Plan and Services Additional resources added to the After Visit Summary for   In-house Referral: Clinical Social Work                                   Social Drivers of Health (SDOH) Interventions SDOH Screenings   Food Insecurity: Food Insecurity Present (08/13/2024)  Housing: High Risk (08/14/2024)  Transportation Needs: Patient Unable To Answer (08/13/2024)  Utilities: At Risk (08/13/2024)  Tobacco Use: High Risk (08/13/2024)     Readmission Risk Interventions     No data to display
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
## 2024-08-16 DIAGNOSIS — F1590 Other stimulant use, unspecified, uncomplicated: Secondary | ICD-10-CM

## 2024-08-16 DIAGNOSIS — F10230 Alcohol dependence with withdrawal, uncomplicated: Secondary | ICD-10-CM

## 2024-08-16 DIAGNOSIS — R569 Unspecified convulsions: Secondary | ICD-10-CM | POA: Diagnosis not present

## 2024-08-16 DIAGNOSIS — F1190 Opioid use, unspecified, uncomplicated: Secondary | ICD-10-CM

## 2024-08-16 LAB — COMPREHENSIVE METABOLIC PANEL WITH GFR
ALT: 24 U/L (ref 0–44)
AST: 29 U/L (ref 15–41)
Albumin: 3.3 g/dL — ABNORMAL LOW (ref 3.5–5.0)
Alkaline Phosphatase: 50 U/L (ref 38–126)
Anion gap: 11 (ref 5–15)
BUN: <5 mg/dL — ABNORMAL LOW (ref 6–20)
CO2: 27 mmol/L (ref 22–32)
Calcium: 8.8 mg/dL — ABNORMAL LOW (ref 8.9–10.3)
Chloride: 98 mmol/L (ref 98–111)
Creatinine, Ser: 0.87 mg/dL (ref 0.61–1.24)
GFR, Estimated: >60 mL/min (ref >60)
Glucose, Bld: 117 mg/dL — ABNORMAL HIGH (ref 70–99)
Potassium: 3.8 mmol/L (ref 3.5–5.1)
Sodium: 136 mmol/L (ref 135–145)
Total Bilirubin: 0.4 mg/dL (ref 0.0–1.2)
Total Protein: 6.4 g/dL — ABNORMAL LOW (ref 6.5–8.1)

## 2024-08-16 LAB — CBC WITH DIFFERENTIAL/PLATELET
Abs Immature Granulocytes: 0.04 K/uL (ref 0.00–0.07)
Basophils Absolute: 0 K/uL (ref 0.0–0.1)
Basophils Relative: 1 %
Eosinophils Absolute: 0.1 K/uL (ref 0.0–0.5)
Eosinophils Relative: 1 %
HCT: 45.7 % (ref 39.0–52.0)
Hemoglobin: 16 g/dL (ref 13.0–17.0)
Immature Granulocytes: 1 %
Lymphocytes Relative: 25 %
Lymphs Abs: 2 K/uL (ref 0.7–4.0)
MCH: 30.4 pg (ref 26.0–34.0)
MCHC: 35 g/dL (ref 30.0–36.0)
MCV: 86.7 fL (ref 80.0–100.0)
Monocytes Absolute: 0.9 K/uL (ref 0.1–1.0)
Monocytes Relative: 11 %
Neutro Abs: 4.8 K/uL (ref 1.7–7.7)
Neutrophils Relative %: 61 %
Platelets: 294 K/uL (ref 150–400)
RBC: 5.27 MIL/uL (ref 4.22–5.81)
RDW: 13.2 % (ref 11.5–15.5)
WBC: 7.9 K/uL (ref 4.0–10.5)
nRBC: 0 % (ref 0.0–0.2)

## 2024-08-16 LAB — GLUCOSE, CAPILLARY
Glucose-Capillary: 120 mg/dL — ABNORMAL HIGH (ref 70–99)
Glucose-Capillary: 129 mg/dL — ABNORMAL HIGH (ref 70–99)
Glucose-Capillary: 132 mg/dL — ABNORMAL HIGH (ref 70–99)
Glucose-Capillary: 133 mg/dL — ABNORMAL HIGH (ref 70–99)

## 2024-08-16 LAB — MAGNESIUM: Magnesium: 2 mg/dL (ref 1.7–2.4)

## 2024-08-16 MED ORDER — GABAPENTIN 100 MG PO CAPS
200.0000 mg | ORAL_CAPSULE | Freq: Three times a day (TID) | ORAL | Status: DC
  Administered 2024-08-16 – 2024-08-17 (×3): 200 mg via ORAL
  Filled 2024-08-16 (×3): qty 2

## 2024-08-16 MED ORDER — METOPROLOL TARTRATE 100 MG PO TABS
100.0000 mg | ORAL_TABLET | Freq: Two times a day (BID) | ORAL | Status: DC
  Administered 2024-08-16 (×2): 100 mg via ORAL
  Filled 2024-08-16 (×2): qty 1
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
## 2024-08-16 NOTE — TOC Progression Note (Signed)
 Transition of Care [facility]) - Progression Note    Patient Details  Name: Seth Luna MRN: [id number] Date of Birth: 11-Jun-1996  Transition of Care [facility]) CM/SW Contact  Dino CHRISTELLA Au, LCSWA Phone Number: 08/16/2024, 10:42 AM  Clinical Narrative:     Per MD, pt not medically ready today.    Expected Discharge Plan: Psychiatric Hospital Barriers to Discharge: Continued Medical Work up    Expected Discharge Plan and Services In-house Referral: Clinical Social Work     Living arrangements for the past 2 months: Single Family Home Expected Discharge Date: 08/15/24                                     Social Drivers of Health (SDOH) Interventions SDOH Screenings   Food Insecurity: Food Insecurity Present (08/13/2024)  Housing: High Risk (08/14/2024)  Transportation Needs: Patient Unable To Answer (08/13/2024)  Utilities: At Risk (08/13/2024)  Tobacco Use: High Risk (08/13/2024)    Readmission Risk Interventions     No data to display

## 2024-08-16 NOTE — Progress Notes (Signed)
 PROGRESS NOTE                                                                                                                                                                                                             Patient Demographics:    Seth Luna, is a 28 y.o. male, DOB - 1996-05-20, FMW:[id number]  Outpatient Primary MD for the patient is Patient, No Pcp Per    LOS - 3  Admit date - 08/13/2024    Chief Complaint  Patient presents with   Seizures   Drug Overdose       Brief Narrative (HPI from H&P)   28 y.o. male with medical history significant of ADHD, depression and history of substance abuse.  Patient presented to the ED on 10/21 via EMS after having seizure at home that lasted 3 to 5 minutes apparently has a history of seizures 5 years ago but not on AEDs.    Per report patient was drinking heavily and also took multiple pills as he was upset after breaking up with his girlfriend.  Unknown if this was an intentional suicidal attempt.Patient was initially in the [facility] ED where he was IVC 8. CT head, cervical spine and other trauma workup was negative. He was transferred to [facility] ED for LTM EEG.  He has been seen by psychiatry and neurology, started on Keppra.  Transferred to my care on 08/16/2024.   Subjective:    Penne Bars today has, No headache, No chest pain, No abdominal pain - No Nausea, No new weakness tingling or numbness, no SOB   Assessment  & Plan :   Seizure-like activity (HCC) No further seizure-like activity noted, initial EEG was negative for any seizures.  Prolonged EEG stable, seen by neurology. - Now started on Vimpat, seizure-free, do not drive for 6 months, postdischarge follow-up with neurology.   Tricyclic overdose Patient is more awake and interactive, stating that he was just upset with no suicidal intention.  Last QTc of 499 and no chest pain.  UDS positive for  amphetamines and tricyclic's. - Patient has similar attempts in the past so psych is recommending inpatient behavioral health services where he is being discharged.   Alcohol use Apparently he was drinking excessively as he was upset. -Continue with supplement and CIWA protocol   ADHD (attention deficit hyperactivity disorder)  History of ADHD, apparently was not on any medications.  Hypertension with sinus tachycardia.  Symptom-free, TSH stable, beta-blocker adjusted on 08/16/2024.  Monitor.      Condition - Fair  Family Communication  :  None  Code Status :  Full  Consults  :  Psych, Neuro  PUD Prophylaxis :     Procedures  :            Disposition Plan  :    Status is: Inpatient  DVT Prophylaxis  :    enoxaparin  (LOVENOX ) injection 40 mg Start: 08/13/24 1045    Lab Results  Component Value Date   PLT 294 08/16/2024    Diet :  Diet Order             Diet - low sodium heart healthy           Diet regular Room service appropriate? Yes; Fluid consistency: Thin  Diet effective now                    Inpatient Medications  Scheduled Meds:  vitamin B-12  1,000 mcg Oral Daily   enoxaparin  (LOVENOX ) injection  40 mg Subcutaneous Q24H   FLUoxetine   20 mg Oral Daily   folic acid  1 mg Oral Daily   lacosamide  50 mg Oral BID   metoprolol tartrate  100 mg Oral BID   multivitamin with minerals  1 tablet Oral Daily   nicotine   21 mg Transdermal Daily   sodium chloride  flush  3 mL Intravenous Q12H   thiamine  100 mg Oral Daily   Continuous Infusions: PRN Meds:.acetaminophen  **OR** acetaminophen , LORazepam  **OR** LORazepam , LORazepam   Antibiotics  :    Anti-infectives (From admission, onward)    None         Objective:   Vitals:   08/15/24 1704 08/15/24 2229 08/16/24 0003 08/16/24 0730  BP: (!) 132/95 (!) 140/105 (!) 146/89 (!) 148/105  Pulse: (!) 106 (!) 115 100 (!) 116  Resp: 20  20   Temp: 97.8 F (36.6 C)  98.7 F (37.1 C) 98 F  (36.7 C)  TempSrc: Oral  Oral Oral  SpO2: 97%  100%   Height:        Wt Readings from Last 3 Encounters:  08/12/24 63 kg  01/04/24 59 kg  04/21/22 59 kg     Intake/Output Summary (Last 24 hours) at 08/16/2024 0833 Last data filed at 08/15/2024 1839 Gross per 24 hour  Intake 720 ml  Output --  Net 720 ml     Physical Exam  Awake Alert, No new F.N deficits, Normal affect [place].AT,PERRAL Supple Neck, No JVD,   Symmetrical Chest wall movement, Good air movement bilaterally, CTAB RRR,No Gallops,Rubs or new Murmurs,  +ve B.Sounds, Abd Soft, No tenderness,   No Cyanosis, Clubbing or edema        Data Review:    Recent Labs  Lab 08/12/24 1318 08/13/24 0440 08/14/24 0559 08/16/24 0418  WBC 8.0 8.1 5.8 7.9  HGB 14.8 13.7 14.8 16.0  HCT 41.7 40.3 42.9 45.7  PLT 251 275 232 294  MCV 86.0 89.4 88.8 86.7  MCH 30.5 30.4 30.6 30.4  MCHC 35.5 34.0 34.5 35.0  RDW 13.2 13.6 13.5 13.2  LYMPHSABS 1.1  --   --  2.0  MONOABS 0.5  --   --  0.9  EOSABS 0.0  --   --  0.1  BASOSABS 0.0  --   --  0.0  Recent Labs  Lab 08/12/24 1318 08/12/24 1409 08/13/24 0440 08/13/24 1500 08/14/24 0559 08/16/24 0418  NA 140  --  138  --  140 136  K 4.1  --  4.0  --  3.8 3.8  CL 101  --  107  --  103 98  CO2 27  --  23  --  25 27  ANIONGAP 12  --  8  --  12 11  GLUCOSE 127*  --  87  --  109* 117*  BUN 7  --  <5*  --  <5* <5*  CREATININE 0.66  --  0.72  --  0.96 0.87  AST 33  --  26  --   --  29  ALT 24  --  20  --   --  24  ALKPHOS 55  --  54  --   --  50  BILITOT 0.3  --  0.7  --   --  0.4  ALBUMIN 3.8  --  3.1*  --   --  3.3*  LATICACIDVEN  --  1.8  --   --   --   --   TSH  --   --   --  2.313  --   --   AMMONIA  --   --   --  57*  --   --   MG 1.9  --  2.5*  --   --  2.0  CALCIUM 8.3*  --  8.1*  --  8.4* 8.8*      Recent Labs  Lab 08/12/24 1318 08/12/24 1409 08/13/24 0440 08/13/24 1500 08/14/24 0559 08/16/24 0418  LATICACIDVEN  --  1.8  --   --   --   --   TSH  --    --   --  2.313  --   --   AMMONIA  --   --   --  57*  --   --   MG 1.9  --  2.5*  --   --  2.0  CALCIUM 8.3*  --  8.1*  --  8.4* 8.8*    --------------------------------------------------------------------------------------------------------------- No results found for: CHOL, HDL, LDLCALC, LDLDIRECT, TRIG, CHOLHDL  No results found for: HGBA1C Recent Labs    08/13/24 1500  TSH 2.313   Recent Labs    08/13/24 1500 08/14/24 0559  VITAMINB12 229  --   FOLATE  --  11.7      Signature  -   Lavada Stank M.D on 08/16/2024 at 8:33 AM   -  To page go to [email or web address]

## 2024-08-16 NOTE — Plan of Care (Addendum)
 Telemetry notified writer at change of shift that pt was having ST elevations. Pt. Denied pain, no acute distress noted or reported. RN Lynwood and Dr. Dennise made aware. Problem: Education: Goal: Knowledge of General Education information will improve Description: Including pain rating scale, medication(s)/side effects and non-pharmacologic comfort measures Outcome: Progressing   Problem: Health Behavior/Discharge Planning: Goal: Ability to manage health-related needs will improve Outcome: Progressing   Problem: Clinical Measurements: Goal: Ability to maintain clinical measurements within normal limits will improve Outcome: Progressing Goal: Will remain free from infection Outcome: Progressing Goal: Diagnostic test results will improve Outcome: Progressing Goal: Respiratory complications will improve Outcome: Progressing Goal: Cardiovascular complication will be avoided Outcome: Progressing   Problem: Activity: Goal: Risk for activity intolerance will decrease Outcome: Progressing   Problem: Nutrition: Goal: Adequate nutrition will be maintained Outcome: Progressing   Problem: Coping: Goal: Level of anxiety will decrease Outcome: Progressing   Problem: Elimination: Goal: Will not experience complications related to bowel motility Outcome: Progressing Goal: Will not experience complications related to urinary retention Outcome: Progressing   Problem: Pain Managment: Goal: General experience of comfort will improve and/or be controlled Outcome: Progressing   Problem: Safety: Goal: Ability to remain free from injury will improve Outcome: Progressing   Problem: Skin Integrity: Goal: Risk for impaired skin integrity will decrease Outcome: Progressing

## 2024-08-17 DIAGNOSIS — R569 Unspecified convulsions: Secondary | ICD-10-CM | POA: Diagnosis not present

## 2024-08-17 LAB — CBC WITH DIFFERENTIAL/PLATELET
Abs Immature Granulocytes: 0.06 K/uL (ref 0.00–0.07)
Basophils Absolute: 0.1 K/uL (ref 0.0–0.1)
Basophils Relative: 1 %
Eosinophils Absolute: 0.1 K/uL (ref 0.0–0.5)
Eosinophils Relative: 2 %
HCT: 47.7 % (ref 39.0–52.0)
Hemoglobin: 16.3 g/dL (ref 13.0–17.0)
Immature Granulocytes: 1 %
Lymphocytes Relative: 31 %
Lymphs Abs: 2.8 K/uL (ref 0.7–4.0)
MCH: 30.1 pg (ref 26.0–34.0)
MCHC: 34.2 g/dL (ref 30.0–36.0)
MCV: 88 fL (ref 80.0–100.0)
Monocytes Absolute: 1.2 K/uL — ABNORMAL HIGH (ref 0.1–1.0)
Monocytes Relative: 13 %
Neutro Abs: 4.9 K/uL (ref 1.7–7.7)
Neutrophils Relative %: 52 %
Platelets: 314 K/uL (ref 150–400)
RBC: 5.42 MIL/uL (ref 4.22–5.81)
RDW: 13.5 % (ref 11.5–15.5)
WBC: 9.1 K/uL (ref 4.0–10.5)
nRBC: 0 % (ref 0.0–0.2)

## 2024-08-17 LAB — COMPREHENSIVE METABOLIC PANEL WITH GFR
ALT: 27 U/L (ref 0–44)
AST: 37 U/L (ref 15–41)
Albumin: 3.4 g/dL — ABNORMAL LOW (ref 3.5–5.0)
Alkaline Phosphatase: 56 U/L (ref 38–126)
Anion gap: 13 (ref 5–15)
BUN: 8 mg/dL (ref 6–20)
CO2: 26 mmol/L (ref 22–32)
Calcium: 9.3 mg/dL (ref 8.9–10.3)
Chloride: 99 mmol/L (ref 98–111)
Creatinine, Ser: 0.94 mg/dL (ref 0.61–1.24)
GFR, Estimated: >60 mL/min (ref >60)
Glucose, Bld: 124 mg/dL — ABNORMAL HIGH (ref 70–99)
Potassium: 4.5 mmol/L (ref 3.5–5.1)
Sodium: 138 mmol/L (ref 135–145)
Total Bilirubin: 0.4 mg/dL (ref 0.0–1.2)
Total Protein: 6.7 g/dL (ref 6.5–8.1)

## 2024-08-17 LAB — MAGNESIUM: Magnesium: 2.2 mg/dL (ref 1.7–2.4)

## 2024-08-17 LAB — GLUCOSE, CAPILLARY: Glucose-Capillary: 101 mg/dL — ABNORMAL HIGH (ref 70–99)

## 2024-08-17 MED ORDER — METOPROLOL TARTRATE 50 MG PO TABS
75.0000 mg | ORAL_TABLET | Freq: Two times a day (BID) | ORAL | Status: DC
  Administered 2024-08-17: 75 mg via ORAL
  Filled 2024-08-17: qty 1

## 2024-08-17 MED ORDER — METOPROLOL TARTRATE 75 MG PO TABS: 75.0000 mg | ORAL_TABLET | Freq: Two times a day (BID) | ORAL | Status: AC

## 2024-08-17 MED ORDER — NICOTINE 21 MG/24HR TD PT24: 21.0000 mg | MEDICATED_PATCH | Freq: Every day | TRANSDERMAL | Status: AC

## 2024-08-17 NOTE — Progress Notes (Signed)
 Report given to [facility], lpn

## 2024-08-17 NOTE — Plan of Care (Signed)

## 2024-08-17 NOTE — TOC Transition Note (Signed)
 Transition of Care [facility]) - Discharge Note   Patient Details  Name: Seth Luna MRN: [id number] Date of Birth: [birth year]  Transition of Care [facility]) CM/SW Contact:  Cena Ligas, LCSW Phone Number: 08/17/2024, 11:08 AM   Clinical Narrative:     Per MD patient ready for DC to [facility]. RN to call report prior to discharge [phone]). RN, patient, and facility notified of DC. IVC sent to facility as requested on 08/15/24. DC packet on chart. Sheriff transport requested for patient.   CSW will sign off for now as social work intervention is no longer needed. Please consult us  again if new needs arise.    Final next level of care: Psychiatric Hospital Barriers to Discharge: Continued Medical Work up   Patient Goals and CMS Choice Patient states their goals for this hospitalization and ongoing recovery are:: Stabilization          Discharge Placement                Patient to be transferred to facility by: [facility]   Patient and family notified of of transfer: 08/15/24  Discharge Plan and Services Additional resources added to the After Visit Summary for   In-house Referral: Clinical Social Work                                   Social Drivers of Health (SDOH) Interventions SDOH Screenings   Food Insecurity: Food Insecurity Present (08/13/2024)  Housing: High Risk (08/14/2024)  Transportation Needs: Patient Unable To Answer (08/13/2024)  Utilities: At Risk (08/13/2024)  Tobacco Use: High Risk (08/13/2024)     Readmission Risk Interventions     No data to display

## 2024-08-17 NOTE — Discharge Summary (Signed)
 Seth Luna:[id number] DOB: March 26, 1996 DOA: 08/13/2024  PCP: Patient, No Pcp Per  Admit date: 08/13/2024  Discharge date: 08/17/2024  Admitted From: Home   Disposition: Inpatient psych   Recommendations for Outpatient Follow-up:   Follow up with PCP in 1-2 weeks  PCP Please obtain BMP/CBC, 2 view CXR in 1week,  (see Discharge instructions)   PCP Please follow up on the following pending results:    Home Health: None Equipment/Devices: None Consultations: Neurology, psychiatry Discharge Condition: Stable    CODE STATUS: Full    Diet Recommendation: Heart Healthy     Chief Complaint  Patient presents with   Seizures   Drug Overdose     Brief history of present illness from the day of admission and additional interim summary    28 y.o. male with medical history significant of ADHD, depression and history of substance abuse.  Patient presented to the ED on 10/21 via EMS after having seizure at home that lasted 3 to 5 minutes apparently has a history of seizures 5 years ago but not on AEDs.    Per report patient was drinking heavily and also took multiple pills as he was upset after breaking up with his girlfriend.  Unknown if this was an intentional suicidal attempt.Patient was initially in the [facility] ED where he was IVC 8. CT head, cervical spine and other trauma workup was negative. He was transferred to [facility] ED for LTM EEG.   He has been seen by psychiatry and neurology, started on Keppra.  Transferred to my care on 08/16/2024.                                                                 Hospital Course   Seizure-like activity [facility]) No further seizure-like activity noted, initial EEG was negative for any seizures.  Prolonged EEG stable, seen by neurology. - Now started on Vimpat,  seizure-free, do not drive for 6 months, postdischarge follow-up with neurology.  Seizure instructions given in writing.   Tricyclic overdose Patient is more awake and interactive, stating that he was just upset with no suicidal intention.  Last QTc of 499 and no chest pain.  UDS positive for amphetamines and tricyclic's. - Patient has similar attempts in the past so psych is recommending inpatient behavioral health services where he is being discharged.   Alcohol use Apparently he was drinking excessively as he was upset. - No signs of DTs.  Discharged to inpatient psych.  Strictly counseled to abstain from alcohol and all recreational drugs.   ADHD (attention deficit hyperactivity disorder) History of ADHD, apparently was not on any medications.  Defer this to the psych team.   Hypertension with sinus tachycardia.  Symptom-free, TSH stable, beta-blocker adjusted on 08/17/2024.  Blood pressure and heart rate stable this morning,  will be discharged on present dose beta-blocker with close outpatient PCP follow-up in 1 to 2 weeks.   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  Follow with Primary MD  in 7 days   Get CBC, CMP, Magnesium, 2 view Chest X ray -  checked  next visit with your primary MD    Activity: As tolerated with Full fall precautions use walker/cane & assistance as needed  Disposition inpatient stay  Diet: Heart Healthy   Special Instructions: If you have smoked or chewed Tobacco  in the last 2 yrs please stop smoking, stop any regular Alcohol  and or any Recreational drug use.  On your next visit with your primary care physician please Get Medicines reviewed and adjusted.  Please request your Prim.MD to go over all Hospital Tests and Procedure/Radiological results at the follow up, please get all Hospital records sent to your Prim MD by signing hospital release before you go home.  If you experience worsening of your admission symptoms, develop shortness of breath, life threatening emergency, suicidal or homicidal thoughts you must seek medical attention immediately by calling 911 or calling your MD immediately  if symptoms less severe.  You Must read complete instructions/literature along with all the possible adverse reactions/side effects for all the Medicines you take and that have been prescribed to you. Take any new Medicines after you have completely understood and accpet all the possible adverse reactions/side effects.   Do not drive when taking Pain medications.  Do not take more than prescribed Pain, Sleep and Anxiety Medications  Wear Seat belts while driving.   Increase activity slowly   Complete by: As directed        Discharge Medications   Allergies as of 08/17/2024       Reactions   Haldol  [haloperidol  Lactate] Other (See Comments)   Dystonic reaction   Haloperidol  And Related    Dystonic reaction         Medication List     STOP taking these medications    escitalopram  5 MG tablet Commonly known as: LEXAPRO    ibuprofen  200 MG tablet Commonly known as: ADVIL    traZODone  100 MG tablet Commonly known as: DESYREL        TAKE these medications    cyanocobalamin 1000 MCG tablet Take 1 tablet  (1,000 mcg total) by mouth daily.   FLUoxetine  20 MG capsule Commonly known as: PROZAC  Take 1 capsule (20 mg total) by mouth daily.   folic acid 1 MG tablet Commonly known as: FOLVITE Take 1 tablet (1 mg total) by mouth daily.   lacosamide 50 MG Tabs tablet Commonly known as: VIMPAT Take 1 tablet (50 mg total) by mouth 2 (two) times daily.  Metoprolol Tartrate 75 MG Tabs Take 1 tablet (75 mg total) by mouth 2 (two) times daily.   multivitamin with minerals Tabs tablet Take 1 tablet by mouth daily.   nicotine  21 mg/24hr patch Commonly known as: NICODERM CQ  - dosed in mg/24 hours Place 1 patch (21 mg total) onto the skin daily.   thiamine 100 MG tablet Commonly known as: Vitamin B-1 Take 1 tablet (100 mg total) by mouth daily.         Follow-up Information     [facility]. Schedule an appointment as soon as possible for a visit in 2 week(s).   Contact information: [address]     [address] [place]  [address] [phone]                Major procedures and Radiology Reports - PLEASE review detailed and final reports thoroughly  -     MR BRAIN WO CONTRAST Result Date: 08/13/2024 EXAM: MRI BRAIN WITHOUT CONTRAST 08/13/2024 11:37:24 PM TECHNIQUE: Multiplanar multisequence MRI of the head/brain was performed without the administration of intravenous contrast. COMPARISON: None available. CLINICAL HISTORY: Seizure, new-onset, no history of trauma. FINDINGS: BRAIN AND VENTRICLES: No acute infarct. No intracranial hemorrhage. No mass. No midline shift. No hydrocephalus. The sella is unremarkable. Normal flow voids. ORBITS: No acute abnormality. SINUSES AND MASTOIDS: No acute abnormality. BONES AND SOFT TISSUES: Normal marrow signal. No acute soft tissue abnormality. IMPRESSION: 1. Normal brain MRI.  No likely seizure etiology identified. Electronically signed by: Franky Stanford MD 08/13/2024 11:59 PM EDT RP Workstation: [id number]    Overnight EEG with video Result Date: 08/13/2024 Shelton Arlin KIDD, MD     08/14/2024 10:38 AM Patient Name: Seth Luna MRN: [id number] Epilepsy Attending: Arlin KIDD Shelton Referring Physician/Provider: Michaela Aisha SQUIBB, MD Duration: 08/13/2024 0511 to 2150 Patient history: 28 y.o. male with hx of single prior seizure 5 years ago without identifiable trigger per girlfriend, not on AEDs, who presented with AMS in the setting of drug overdose with 2 witnessed GTC seizures without return to baseline. EEG to evaluate for seizure Level of alertness: Awake, asleep AEDs during EEG study: None Technical aspects: This EEG study was done with scalp electrodes positioned according to the 10-20 International system of electrode placement. Electrical activity was reviewed with band pass filter of 1-70Hz , sensitivity of 7 uV/mm, display speed of 56mm/sec with a 60Hz  notched filter applied as appropriate. EEG data were recorded continuously and digitally stored.  Video monitoring was available and reviewed as appropriate. Description: The posterior dominant rhythm consists of 8-9 Hz activity of moderate voltage (25-35 uV) seen predominantly in posterior head regions, symmetric and reactive to eye opening and eye closing. Sleep was characterized by vertex waves, sleep spindles (12 to 14 Hz), maximal frontocentral region. Hyperventilation and photic stimulation were not performed.   IMPRESSION: This study is within normal limits. No seizures or epileptiform discharges were seen throughout the recording. A normal interictal EEG does not exclude the diagnosis of epilepsy. Arlin KIDD Shelton   DG Chest Portable 1 View Result Date: 08/12/2024 EXAM: 1 VIEW(S) XRAY OF THE CHEST 08/12/2024 04:02:00 PM COMPARISON: 01/04/2024 CLINICAL HISTORY: AMS. FINDINGS: LUNGS AND PLEURA: Mildly lower lung volumes. No focal pulmonary opacity. No pulmonary edema. No pleural effusion. No pneumothorax. HEART AND MEDIASTINUM: No acute  abnormality of the cardiac and mediastinal silhouettes. BONES AND SOFT TISSUES: No acute osseous abnormality. IMPRESSION: 1. No acute cardiopulmonary process. Electronically signed by: Waddell Calk MD 08/12/2024 04:41 PM EDT RP  Workstation: [id number]   CT Cervical Spine Wo Contrast Result Date: 08/12/2024 CLINICAL DATA:  Ataxia, cervical trauma Seizure this morning. EXAM: CT CERVICAL SPINE WITHOUT CONTRAST TECHNIQUE: Multidetector CT imaging of the cervical spine was performed without intravenous contrast. Multiplanar CT image reconstructions were also generated. RADIATION DOSE REDUCTION: This exam was performed according to the departmental dose-optimization program which includes automated exposure control, adjustment of the mA and/or kV according to patient size and/or use of iterative reconstruction technique. COMPARISON:  CT cervical spine 02/14/2014. FINDINGS: Alignment: Straightening without focal angulation or listhesis. Skull base and vertebrae: No evidence of acute cervical spine fracture or traumatic subluxation. Soft tissues and spinal canal: No prevertebral fluid or swelling. No visible canal hematoma. Disc levels: The cervical disc heights are maintained. No evidence of significant disc herniation, spinal stenosis or foraminal narrowing. Upper chest: Clear lung apices. Other: None. IMPRESSION: 1. No evidence of acute cervical spine fracture, traumatic subluxation or static signs of instability. 2. Straightening of the cervical spine which may be positional or secondary to muscle spasm. Electronically Signed   By: Elsie Perone M.D.   On: 08/12/2024 14:45   CT HEAD WO CONTRAST ( ) Result Date: 08/12/2024 EXAM: CT HEAD WITHOUT CONTRAST 08/12/2024 02:31:14 PM TECHNIQUE: CT of the head was performed without the administration of intravenous contrast. Automated exposure control, iterative reconstruction, and/or weight based adjustment of the mA/kV was utilized to reduce the radiation dose to  as low as reasonably achievable. COMPARISON: None available. CLINICAL HISTORY: Head trauma, abnormal mental status (Age 8-64y). Triage note: Pt BIB [facility] from Home for having a seizure this morning. EMS reports seizure was tonic - clonic lasting 3-5 min. Pt has hx of seizure 5 years ago, does not take any meds for it, has not had one since. EMS states pt started drinking a lot last night due to GF breaking up with him. EMS also reporting pt took 10-15 pills of nortriptyline around 0400 this morning, unknown if this was intentional or not. Medication bottle is not for the pt, unknown where pt received this medicine. Pt mentioned to people in house that he was outside to vomit, unsure if pt vomited meds or not. EMS states pt has been restless in postictal state however not violent. FINDINGS: BRAIN AND VENTRICLES: No acute hemorrhage. No evidence of acute infarct. No hydrocephalus. No extra-axial collection. No mass effect or midline shift. ORBITS: No acute abnormality. SINUSES: No acute abnormality. SOFT TISSUES AND SKULL: No acute soft tissue abnormality. Remote nasal bone fracture. No skull fracture. IMPRESSION: 1. No acute intracranial abnormality. 2. Remote nasal bone fracture. Electronically signed by: Donnice Mania MD 08/12/2024 02:41 PM EDT RP Workstation: [id number]    Micro Results    No results found for this or any previous visit (from the past 240 hours).  Today   Subjective    Holland Nickson today has no headache,no chest abdominal pain,no new weakness tingling or numbness, feels much better .   Objective   Blood pressure 116/78, pulse 79, temperature 99.6 F (37.6 C), temperature source Oral, resp. rate 18, height 5' 7 (1.702 m), SpO2 99%.  No intake or output data in the 24 hours ending 08/17/24 0719  Exam  Awake Alert, No new F.N deficits,    [place].AT,PERRAL Supple Neck,   Symmetrical Chest wall movement, Good air movement bilaterally, CTAB RRR,No Gallops,   +ve B.Sounds,  Abd Soft, Non tender,  No Cyanosis, Clubbing or edema    Data Review   Recent Labs  Lab  08/12/24 1318 08/13/24 0440 08/14/24 0559 08/16/24 0418 08/17/24 0257  WBC 8.0 8.1 5.8 7.9 9.1  HGB 14.8 13.7 14.8 16.0 16.3  HCT 41.7 40.3 42.9 45.7 47.7  PLT 251 275 232 294 314  MCV 86.0 89.4 88.8 86.7 88.0  MCH 30.5 30.4 30.6 30.4 30.1  MCHC 35.5 34.0 34.5 35.0 34.2  RDW 13.2 13.6 13.5 13.2 13.5  LYMPHSABS 1.1  --   --  2.0 2.8  MONOABS 0.5  --   --  0.9 1.2*  EOSABS 0.0  --   --  0.1 0.1  BASOSABS 0.0  --   --  0.0 0.1    Recent Labs  Lab 08/12/24 1318 08/12/24 1409 08/13/24 0440 08/13/24 1500 08/14/24 0559 08/16/24 0418 08/17/24 0257  NA 140  --  138  --  140 136 138  K 4.1  --  4.0  --  3.8 3.8 4.5  CL 101  --  107  --  103 98 99  CO2 27  --  23  --  25 27 26   ANIONGAP 12  --  8  --  12 11 13   GLUCOSE 127*  --  87  --  109* 117* 124*  BUN 7  --  <5*  --  <5* <5* 8  CREATININE 0.66  --  0.72  --  0.96 0.87 0.94  AST 33  --  26  --   --  29 37  ALT 24  --  20  --   --  24 27  ALKPHOS 55  --  54  --   --  50 56  BILITOT 0.3  --  0.7  --   --  0.4 0.4  ALBUMIN 3.8  --  3.1*  --   --  3.3* 3.4*  LATICACIDVEN  --  1.8  --   --   --   --   --   TSH  --   --   --  2.313  --   --   --   AMMONIA  --   --   --  57*  --   --   --   MG 1.9  --  2.5*  --   --  2.0 2.2  CALCIUM 8.3*  --  8.1*  --  8.4* 8.8* 9.3    Total Time in preparing paper work, data evaluation and todays exam - 35 minutes  Signature  -    Lavada Stank M.D on 08/17/2024 at 7:19 AM   -  To page go to [email or web address]

## 2024-08-20 ENCOUNTER — Ambulatory Visit: Payer: MEDICAID | Admitting: Family Medicine

## 2024-10-01 ENCOUNTER — Ambulatory Visit: Payer: MEDICAID | Admitting: Physician Assistant

## 2025-01-05 ENCOUNTER — Ambulatory Visit: Payer: MEDICAID | Admitting: Neurology
